# Patient Record
Sex: Female | Born: 1960 | Race: White | Hispanic: No | Smoking: Never smoker
Health system: Southern US, Community
[De-identification: ages and names within clinical notes are randomized; demographics above are authoritative.]

## PROBLEM LIST (undated history)

## (undated) DIAGNOSIS — R519 Headache, unspecified: Secondary | ICD-10-CM

## (undated) DIAGNOSIS — Z789 Other specified health status: Secondary | ICD-10-CM

## (undated) HISTORY — PX: DILATION AND CURETTAGE OF UTERUS: SHX78

---

## 2002-05-28 ENCOUNTER — Other Ambulatory Visit: Admission: RE | Admit: 2002-05-28 | Discharge: 2002-05-28 | Payer: Self-pay | Admitting: Obstetrics and Gynecology

## 2003-06-05 ENCOUNTER — Other Ambulatory Visit: Admission: RE | Admit: 2003-06-05 | Discharge: 2003-06-05 | Payer: Self-pay | Admitting: Obstetrics and Gynecology

## 2004-09-18 ENCOUNTER — Other Ambulatory Visit: Admission: RE | Admit: 2004-09-18 | Discharge: 2004-09-18 | Payer: Self-pay | Admitting: Obstetrics and Gynecology

## 2008-06-20 ENCOUNTER — Encounter: Admission: RE | Admit: 2008-06-20 | Discharge: 2008-06-20 | Payer: Self-pay | Admitting: Internal Medicine

## 2011-07-13 ENCOUNTER — Other Ambulatory Visit (HOSPITAL_COMMUNITY): Payer: Self-pay | Admitting: Obstetrics and Gynecology

## 2011-07-13 DIAGNOSIS — R1031 Right lower quadrant pain: Secondary | ICD-10-CM

## 2011-07-14 ENCOUNTER — Encounter (HOSPITAL_COMMUNITY): Admission: EM | Disposition: A | Discharge status: Home / Self Care | Payer: Self-pay | Source: Home / Self Care

## 2011-07-14 ENCOUNTER — Inpatient Hospital Stay (HOSPITAL_COMMUNITY): Payer: BC Managed Care – PPO | Admitting: Anesthesiology

## 2011-07-14 ENCOUNTER — Encounter (HOSPITAL_COMMUNITY): Payer: Self-pay | Admitting: Anesthesiology

## 2011-07-14 ENCOUNTER — Ambulatory Visit (HOSPITAL_COMMUNITY)
Admission: RE | Admit: 2011-07-14 | Discharge: 2011-07-14 | Disposition: A | Discharge status: Home / Self Care | Payer: BC Managed Care – PPO | Source: Ambulatory Visit | Attending: Obstetrics and Gynecology | Admitting: Obstetrics and Gynecology

## 2011-07-14 ENCOUNTER — Inpatient Hospital Stay (HOSPITAL_COMMUNITY)
Admission: EM | Admit: 2011-07-14 | Discharge: 2011-07-18 | DRG: 883 | Disposition: A | Discharge status: Home / Self Care | Payer: BC Managed Care – PPO | Attending: Surgery | Admitting: Surgery

## 2011-07-14 ENCOUNTER — Encounter (HOSPITAL_COMMUNITY): Payer: Self-pay | Admitting: *Deleted

## 2011-07-14 ENCOUNTER — Other Ambulatory Visit: Payer: Self-pay

## 2011-07-14 DIAGNOSIS — R112 Nausea with vomiting, unspecified: Secondary | ICD-10-CM | POA: Insufficient documentation

## 2011-07-14 DIAGNOSIS — J309 Allergic rhinitis, unspecified: Secondary | ICD-10-CM | POA: Diagnosis present

## 2011-07-14 DIAGNOSIS — R1031 Right lower quadrant pain: Secondary | ICD-10-CM | POA: Insufficient documentation

## 2011-07-14 DIAGNOSIS — K352 Acute appendicitis with generalized peritonitis, without abscess: Secondary | ICD-10-CM

## 2011-07-14 DIAGNOSIS — K3533 Acute appendicitis with perforation and localized peritonitis, with abscess: Principal | ICD-10-CM | POA: Diagnosis present

## 2011-07-14 DIAGNOSIS — Z01812 Encounter for preprocedural laboratory examination: Secondary | ICD-10-CM

## 2011-07-14 DIAGNOSIS — G4700 Insomnia, unspecified: Secondary | ICD-10-CM | POA: Diagnosis present

## 2011-07-14 DIAGNOSIS — R509 Fever, unspecified: Secondary | ICD-10-CM | POA: Insufficient documentation

## 2011-07-14 DIAGNOSIS — K560 Paralytic ileus: Secondary | ICD-10-CM | POA: Diagnosis present

## 2011-07-14 HISTORY — PX: LAPAROSCOPIC APPENDECTOMY: SHX408

## 2011-07-14 HISTORY — DX: Other specified health status: Z78.9

## 2011-07-14 HISTORY — PX: APPENDECTOMY: SHX54

## 2011-07-14 LAB — DIFFERENTIAL
Eosinophils Absolute: 0.1 10*3/uL (ref 0.0–0.7)
Eosinophils Relative: 1 % (ref 0–5)
Lymphs Abs: 2 10*3/uL (ref 0.7–4.0)
Monocytes Relative: 5 % (ref 3–12)

## 2011-07-14 LAB — BASIC METABOLIC PANEL
BUN: 7 mg/dL (ref 6–23)
Calcium: 9.4 mg/dL (ref 8.4–10.5)
GFR calc non Af Amer: >90 mL/min (ref >90)
Glucose, Bld: 73 mg/dL (ref 70–99)

## 2011-07-14 LAB — CBC
MCH: 31.6 pg (ref 26.0–34.0)
MCV: 92.8 fL (ref 78.0–100.0)
Platelets: 262 10*3/uL (ref 150–400)
RBC: 3.89 MIL/uL (ref 3.87–5.11)

## 2011-07-14 SURGERY — APPENDECTOMY, LAPAROSCOPIC
Anesthesia: General | Site: Abdomen | Wound class: Dirty or Infected

## 2011-07-14 MED ORDER — MORPHINE SULFATE 4 MG/ML IJ SOLN
INTRAMUSCULAR | Status: AC
  Filled 2011-07-14: qty 1

## 2011-07-14 MED ORDER — SUFENTANIL CITRATE 50 MCG/ML IV SOLN
INTRAVENOUS | Status: DC | PRN
  Administered 2011-07-14: 5 ug via INTRAVENOUS
  Administered 2011-07-14: 10 ug via INTRAVENOUS
  Administered 2011-07-14: 5 ug via INTRAVENOUS
  Administered 2011-07-14: 20 ug via INTRAVENOUS
  Administered 2011-07-14: 10 ug via INTRAVENOUS

## 2011-07-14 MED ORDER — ONDANSETRON HCL 4 MG/2ML IJ SOLN
4.0000 mg | Freq: Four times a day (QID) | INTRAMUSCULAR | Status: DC | PRN
  Administered 2011-07-15 – 2011-07-16 (×2): 4 mg via INTRAVENOUS
  Filled 2011-07-14 (×3): qty 2

## 2011-07-14 MED ORDER — KCL IN DEXTROSE-NACL 20-5-0.9 MEQ/L-%-% IV SOLN
INTRAVENOUS | Status: AC
  Filled 2011-07-14: qty 1000

## 2011-07-14 MED ORDER — HEPARIN SODIUM (PORCINE) 5000 UNIT/ML IJ SOLN
5000.0000 [IU] | Freq: Three times a day (TID) | INTRAMUSCULAR | Status: DC
  Administered 2011-07-15 – 2011-07-18 (×10): 5000 [IU] via SUBCUTANEOUS
  Filled 2011-07-14 (×13): qty 1

## 2011-07-14 MED ORDER — PROPOFOL 10 MG/ML IV BOLUS
INTRAVENOUS | Status: DC | PRN
  Administered 2011-07-14: 160 mg via INTRAVENOUS

## 2011-07-14 MED ORDER — IOHEXOL 300 MG/ML  SOLN
100.0000 mL | Freq: Once | INTRAMUSCULAR | Status: AC | PRN
  Administered 2011-07-14: 100 mL via INTRAVENOUS

## 2011-07-14 MED ORDER — PROMETHAZINE HCL 25 MG/ML IJ SOLN: 6.2500 mg | INTRAMUSCULAR | Status: DC | PRN

## 2011-07-14 MED ORDER — BUPIVACAINE-EPINEPHRINE (PF) 0.5% -1:200000 IJ SOLN
INTRAMUSCULAR | Status: AC
  Filled 2011-07-14: qty 10

## 2011-07-14 MED ORDER — CISATRACURIUM BESYLATE 2 MG/ML IV SOLN
INTRAVENOUS | Status: DC | PRN
  Administered 2011-07-14: 2 mg via INTRAVENOUS
  Administered 2011-07-14: 5 mg via INTRAVENOUS

## 2011-07-14 MED ORDER — PANTOPRAZOLE SODIUM 40 MG IV SOLR
40.0000 mg | Freq: Every day | INTRAVENOUS | Status: DC
  Administered 2011-07-14 – 2011-07-15 (×2): 40 mg via INTRAVENOUS
  Filled 2011-07-14 (×3): qty 40

## 2011-07-14 MED ORDER — ACETAMINOPHEN 10 MG/ML IV SOLN
INTRAVENOUS | Status: AC
  Filled 2011-07-14: qty 100

## 2011-07-14 MED ORDER — LIDOCAINE HCL (CARDIAC) 20 MG/ML IV SOLN
INTRAVENOUS | Status: DC | PRN
  Administered 2011-07-14: 100 mg via INTRAVENOUS

## 2011-07-14 MED ORDER — SODIUM CHLORIDE 0.9 % IV SOLN
INTRAVENOUS | Status: AC
  Filled 2011-07-14: qty 1

## 2011-07-14 MED ORDER — ONDANSETRON HCL 4 MG PO TABS
4.0000 mg | ORAL_TABLET | Freq: Four times a day (QID) | ORAL | Status: DC | PRN
  Administered 2011-07-14: 4 mg via ORAL
  Filled 2011-07-14: qty 1

## 2011-07-14 MED ORDER — MORPHINE SULFATE 2 MG/ML IJ SOLN
2.0000 mg | INTRAMUSCULAR | Status: DC | PRN
  Administered 2011-07-14 – 2011-07-15 (×4): 2 mg via INTRAVENOUS
  Filled 2011-07-14 (×4): qty 1

## 2011-07-14 MED ORDER — NEOSTIGMINE METHYLSULFATE 1 MG/ML IJ SOLN
INTRAMUSCULAR | Status: DC | PRN
  Administered 2011-07-14: 4 mg via INTRAVENOUS

## 2011-07-14 MED ORDER — ACETAMINOPHEN 10 MG/ML IV SOLN
INTRAVENOUS | Status: DC | PRN
  Administered 2011-07-14: 1000 mg via INTRAVENOUS

## 2011-07-14 MED ORDER — MORPHINE SULFATE 4 MG/ML IJ SOLN
4.0000 mg | Freq: Once | INTRAMUSCULAR | Status: AC
  Administered 2011-07-14: 4 mg via INTRAVENOUS

## 2011-07-14 MED ORDER — DEXAMETHASONE SODIUM PHOSPHATE 10 MG/ML IJ SOLN
INTRAMUSCULAR | Status: DC | PRN
  Administered 2011-07-14: 6 mg via INTRAVENOUS

## 2011-07-14 MED ORDER — SODIUM CHLORIDE 0.9 % IV SOLN
1.0000 g | INTRAVENOUS | Status: DC
  Administered 2011-07-15 – 2011-07-17 (×3): 1 g via INTRAVENOUS
  Filled 2011-07-14 (×5): qty 1

## 2011-07-14 MED ORDER — HYDROMORPHONE HCL PF 1 MG/ML IJ SOLN
0.2500 mg | INTRAMUSCULAR | Status: DC | PRN
  Administered 2011-07-14 (×2): 0.5 mg via INTRAVENOUS

## 2011-07-14 MED ORDER — 0.9 % SODIUM CHLORIDE (POUR BTL) OPTIME
TOPICAL | Status: DC | PRN
  Administered 2011-07-14: 1000 mL

## 2011-07-14 MED ORDER — ONDANSETRON HCL 4 MG/2ML IJ SOLN
INTRAMUSCULAR | Status: DC | PRN
  Administered 2011-07-14: 4 mg via INTRAVENOUS

## 2011-07-14 MED ORDER — GLYCOPYRROLATE 0.2 MG/ML IJ SOLN
INTRAMUSCULAR | Status: DC | PRN
  Administered 2011-07-14: .6 mg via INTRAVENOUS

## 2011-07-14 MED ORDER — ONDANSETRON HCL 4 MG/2ML IJ SOLN
4.0000 mg | Freq: Once | INTRAMUSCULAR | Status: AC
  Administered 2011-07-14: 4 mg via INTRAVENOUS
  Filled 2011-07-14: qty 2

## 2011-07-14 MED ORDER — MORPHINE SULFATE 4 MG/ML IJ SOLN
8.0000 mg | Freq: Once | INTRAMUSCULAR | Status: AC
  Administered 2011-07-14: 8 mg via INTRAVENOUS
  Filled 2011-07-14: qty 2

## 2011-07-14 MED ORDER — TRAZODONE HCL 50 MG PO TABS
50.0000 mg | ORAL_TABLET | Freq: Every day | ORAL | Status: DC
  Administered 2011-07-15 – 2011-07-17 (×3): 50 mg via ORAL
  Filled 2011-07-14 (×5): qty 1

## 2011-07-14 MED ORDER — BUPIVACAINE-EPINEPHRINE 0.5% -1:200000 IJ SOLN
INTRAMUSCULAR | Status: DC | PRN
  Administered 2011-07-14: 16 mL

## 2011-07-14 MED ORDER — HYDROMORPHONE HCL PF 1 MG/ML IJ SOLN
INTRAMUSCULAR | Status: AC
  Filled 2011-07-14: qty 1

## 2011-07-14 MED ORDER — SODIUM CHLORIDE 0.9 % IV SOLN: Freq: Once | INTRAVENOUS | Status: DC

## 2011-07-14 MED ORDER — SODIUM CHLORIDE 0.9 % IV BOLUS (SEPSIS)
1000.0000 mL | Freq: Once | INTRAVENOUS | Status: AC
  Administered 2011-07-14: 1000 mL via INTRAVENOUS
  Administered 2011-07-14: 125 mL via INTRAVENOUS

## 2011-07-14 MED ORDER — OXYCODONE-ACETAMINOPHEN 5-325 MG PO TABS
1.0000 | ORAL_TABLET | ORAL | Status: DC | PRN
  Administered 2011-07-15 – 2011-07-16 (×4): 2 via ORAL
  Administered 2011-07-17: 1 via ORAL
  Administered 2011-07-17 (×3): 2 via ORAL
  Administered 2011-07-17: 1 via ORAL
  Administered 2011-07-18: 2 via ORAL
  Filled 2011-07-14 (×9): qty 2

## 2011-07-14 MED ORDER — KCL IN DEXTROSE-NACL 20-5-0.9 MEQ/L-%-% IV SOLN
INTRAVENOUS | Status: DC
  Administered 2011-07-14 – 2011-07-15 (×3): via INTRAVENOUS
  Administered 2011-07-16: 20 mL/h via INTRAVENOUS
  Filled 2011-07-14 (×6): qty 1000

## 2011-07-14 MED ORDER — LACTATED RINGERS IR SOLN
Status: DC | PRN
  Administered 2011-07-14: 2000 mL

## 2011-07-14 MED ORDER — ONDANSETRON HCL 4 MG/2ML IJ SOLN
4.0000 mg | Freq: Four times a day (QID) | INTRAMUSCULAR | Status: DC | PRN
  Administered 2011-07-18: 4 mg via INTRAVENOUS

## 2011-07-14 MED ORDER — LACTATED RINGERS IV SOLN
INTRAVENOUS | Status: DC | PRN
  Administered 2011-07-14 (×2): via INTRAVENOUS

## 2011-07-14 MED ORDER — SODIUM CHLORIDE 0.9 % IV SOLN
1.0000 g | INTRAVENOUS | Status: DC
  Administered 2011-07-14: 1 g via INTRAVENOUS
  Filled 2011-07-14 (×2): qty 1

## 2011-07-14 MED ORDER — KCL IN DEXTROSE-NACL 20-5-0.9 MEQ/L-%-% IV SOLN
INTRAVENOUS | Status: DC
  Filled 2011-07-14 (×4): qty 1000

## 2011-07-14 MED ORDER — SUCCINYLCHOLINE CHLORIDE 20 MG/ML IJ SOLN
INTRAMUSCULAR | Status: DC | PRN
  Administered 2011-07-14: 80 mg via INTRAVENOUS

## 2011-07-14 SURGICAL SUPPLY — 40 items
APPLIER CLIP 5 13 M/L LIGAMAX5 (MISCELLANEOUS)
APPLIER CLIP ROT 10 11.4 M/L (STAPLE) ×2
CANISTER SUCTION 2500CC (MISCELLANEOUS) ×2 IMPLANT
CHLORAPREP W/TINT 26ML (MISCELLANEOUS) ×2 IMPLANT
CLIP APPLIE 5 13 M/L LIGAMAX5 (MISCELLANEOUS) IMPLANT
CLIP APPLIE ROT 10 11.4 M/L (STAPLE) ×1 IMPLANT
CLOTH BEACON ORANGE TIMEOUT ST (SAFETY) ×2 IMPLANT
CUTTER FLEX LINEAR 45M (STAPLE) ×2 IMPLANT
DECANTER SPIKE VIAL GLASS SM (MISCELLANEOUS) ×2 IMPLANT
DERMABOND ADVANCED (GAUZE/BANDAGES/DRESSINGS) ×1
DERMABOND ADVANCED .7 DNX12 (GAUZE/BANDAGES/DRESSINGS) ×1 IMPLANT
DRAPE LAPAROSCOPIC ABDOMINAL (DRAPES) ×2 IMPLANT
ELECT REM PT RETURN 9FT ADLT (ELECTROSURGICAL) ×2
ELECTRODE REM PT RTRN 9FT ADLT (ELECTROSURGICAL) ×1 IMPLANT
GLOVE BIOGEL PI IND STRL 7.0 (GLOVE) ×1 IMPLANT
GLOVE BIOGEL PI INDICATOR 7.0 (GLOVE) ×1
GLOVE SS BIOGEL STRL SZ 7.5 (GLOVE) ×1 IMPLANT
GLOVE SUPERSENSE BIOGEL SZ 7.5 (GLOVE) ×1
GOWN STRL NON-REIN LRG LVL3 (GOWN DISPOSABLE) ×2 IMPLANT
GOWN STRL REIN XL XLG (GOWN DISPOSABLE) ×4 IMPLANT
IV LACTATED RINGERS 1000ML (IV SOLUTION) ×2 IMPLANT
KIT BASIN OR (CUSTOM PROCEDURE TRAY) ×2 IMPLANT
NS IRRIG 1000ML POUR BTL (IV SOLUTION) ×4 IMPLANT
PENCIL BUTTON HOLSTER BLD 10FT (ELECTRODE) ×2 IMPLANT
POUCH SPECIMEN RETRIEVAL 10MM (ENDOMECHANICALS) ×2 IMPLANT
RELOAD 45 VASCULAR/THIN (ENDOMECHANICALS) IMPLANT
RELOAD STAPLE TA45 3.5 REG BLU (ENDOMECHANICALS) ×2 IMPLANT
SCALPEL HARMONIC ACE (MISCELLANEOUS) ×2 IMPLANT
SET IRRIG TUBING LAPAROSCOPIC (IRRIGATION / IRRIGATOR) ×2 IMPLANT
SLEEVE Z-THREAD 5X100MM (TROCAR) IMPLANT
SOLUTION ANTI FOG 6CC (MISCELLANEOUS) ×2 IMPLANT
STRIP CLOSURE SKIN 1/2X4 (GAUZE/BANDAGES/DRESSINGS) ×2 IMPLANT
SUT MNCRL AB 4-0 PS2 18 (SUTURE) ×2 IMPLANT
TOWEL OR 17X26 10 PK STRL BLUE (TOWEL DISPOSABLE) ×2 IMPLANT
TRAY FOLEY CATH 14FRSI W/METER (CATHETERS) ×2 IMPLANT
TRAY LAP CHOLE (CUSTOM PROCEDURE TRAY) ×2 IMPLANT
TROCAR XCEL BLUNT TIP 100MML (ENDOMECHANICALS) ×2 IMPLANT
TROCAR Z-THREAD FIOS 12X100MM (TROCAR) ×2 IMPLANT
TROCAR Z-THREAD FIOS 5X100MM (TROCAR) ×2 IMPLANT
TUBING INSUFFLATION 10FT LAP (TUBING) ×2 IMPLANT

## 2011-07-14 NOTE — ED Notes (Signed)
Pt had CT done at [facility] today secondary to abdominal pain, pt was called after sent home and told she needed to come to the ER because of appendicitis. Pt co abdominal pain in RLQ since Sunday morning, pain described as sharp, and stabbing

## 2011-07-14 NOTE — H&P (Signed)
Tina Cannon is an 51 y.o. female.   Referring:  Dr. Marcelle Overlie Chief Complaint: Appendicitis  HPI: Patient's a 51 year old female who started having abdominal pain Sunday when she got up. She said her abdomen got tightened bloated she started having pain going to her right lower quadrant. She developed nausea and vomiting. She had similar symptoms in the past which was thought to be secondary to an ovarian cyst. She essentially just tolerated the symptoms. Monday she attempts to get an appointment with Dr. Marcelle Overlie. But was unable to see him till Tuesday. She continued to have symptoms with pain, nausea, vomiting, and fever. She did see Dr. Marcelle Overlie on Tuesday, his evaluation showed no OB/GYN related problems. A CT scan was ordered but she was unable to have study until this a.m. Today she had a CT scan which showed a large appendix with a tiny appendicolith moderate periappendiceal inflammatory changes consistent with acute appendicitis. There was no evidence of abscess or free fluid. After the study was related to Dr. Marcelle Overlie she was directed to her emergency room here at [facility]. Dr. Marcelle Overlie office contacted [place] and we subsequently contacted the ER. Labs show a minimally elevated white count with her BMP being essentially normal. We will plan to admit the patient for a laparoscopic appendectomy.  PMH:  1. Insomnia             2. Seasonal allergies  Past Surgical History  Procedure Date  . D & C with cone bx.     History reviewed. No pertinent family history. Social History:  does not have a smoking history on file. She does not have any smokeless tobacco history on file. She reports that she drinks alcohol. She reports that she does not use illicit drugs. 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  Allergies: No Known Allergies   (Not in a hospital admission)  Results for orders placed during the hospital encounter of 07/14/11 (from the past 48 hour(s))  CBC     Status:  Abnormal   Collection Time   07/14/11  1:58 PM      Component Value Range Comment   WBC 11.9 (*) 4.0 - 10.5 (K/uL)    RBC 3.89  3.87 - 5.11 (MIL/uL)    Hemoglobin 12.3  12.0 - 15.0 (g/dL)    HCT 16.1  09.6 - 04.5 (%)    MCV 92.8  78.0 - 100.0 (fL)    MCH 31.6  26.0 - 34.0 (pg)    MCHC 34.1  30.0 - 36.0 (g/dL)    RDW 40.9  81.1 - 91.4 (%)    Platelets 262  150 - 400 (K/uL)   DIFFERENTIAL     Status: Abnormal   Collection Time   07/14/11  1:58 PM      Component Value Range Comment   Neutrophils Relative 78 (*) 43 - 77 (%)    Neutro Abs 9.2 (*) 1.7 - 7.7 (K/uL)    Lymphocytes Relative 17  12 - 46 (%)    Lymphs Abs 2.0  0.7 - 4.0 (K/uL)    Monocytes Relative 5  3 - 12 (%)    Monocytes Absolute 0.5  0.1 - 1.0 (K/uL)    Eosinophils Relative 1  0 - 5 (%)    Eosinophils Absolute 0.1  0.0 - 0.7 (K/uL)    Basophils Relative 0  0 - 1 (%)    Basophils Absolute 0.0  0.0 - 0.1 (K/uL)   BASIC METABOLIC PANEL  Status: Normal   Collection Time   07/14/11  1:58 PM      Component Value Range Comment   Sodium 138  135 - 145 (mEq/L)    Potassium 3.6  3.5 - 5.1 (mEq/L)    Chloride 102  96 - 112 (mEq/L)    CO2 26  19 - 32 (mEq/L)    Glucose, Bld 73  70 - 99 (mg/dL)    BUN 7  6 - 23 (mg/dL)    Creatinine, Ser 9.60  0.50 - 1.10 (mg/dL)    Calcium 9.4  8.4 - 10.5 (mg/dL)    GFR calc non Af Amer >90  >90 (mL/min)    GFR calc Af Amer >90  >90 (mL/min)   PROTIME-INR     Status: Normal   Collection Time   07/14/11  1:58 PM      Component Value Range Comment   Prothrombin Time 13.7  11.6 - 15.2 (seconds)    INR 1.03  0.00 - 1.49     Ct Abdomen Pelvis W Contrast  07/14/2011  *RADIOLOGY REPORT*  Clinical Data: Right lower quadrant pain.  Nausea and vomiting. Fever.  CT ABDOMEN AND PELVIS WITH CONTRAST  Technique:  Multidetector CT imaging of the abdomen and pelvis was performed following the standard protocol during bolus administration of intravenous contrast.  Contrast: OMNIPAQUE IOHEXOL 300  MG/ML  SOLN  Comparison: None.  Findings: The abdominal parenchymal organs are normal in appearance.  Gallbladder is unremarkable.  No evidence of hydronephrosis.  No soft tissue masses or lymphadenopathy identified within the abdomen or pelvis.  Uterus and ovaries are unremarkable in appearance.  An enlarged appendix is seen with a tiny appendicolith and moderate periappendiceal inflammatory changes.  This is consistent with acute appendicitis.  There is no evidence of abscess or free fluid. No evidence of bowel obstruction.  IMPRESSION:  1.  Positive for acute appendicitis. 2.  No evidence of abscess or other complication.  These results were called by telephone on 07/14/2011  at  0940 hours to  Dr. Dennie Bible office nurse Kennyth Arnold, who verbally acknowledged these results.  Original Report Authenticated By: Danae Orleans, M.D.    Review of Systems  Constitutional: Positive for fever (up to 102 since Sunday) and chills. Negative for weight loss, malaise/fatigue and diaphoresis.  Eyes: Negative.   Respiratory: Negative.   Cardiovascular: Negative.   Gastrointestinal: Positive for nausea, vomiting, abdominal pain, diarrhea and constipation. Negative for heartburn and blood in stool.  Genitourinary: Negative.   Musculoskeletal: Negative.   Skin: Negative.   Neurological: Negative.  Negative for weakness.  Endo/Heme/Allergies: Negative.   Psychiatric/Behavioral: Negative.     Blood pressure 112/65, pulse 106, temperature 99.7 F (37.6 C), temperature source Oral, resp. rate 18, height 5\' 7"  (1.702 m), weight 77.111 kg (170 lb), last menstrual period 06/25/2011, SpO2 100.00%. Physical Exam  Constitutional: She is oriented to person, place, and time. She appears well-developed and well-nourished. No distress.       Feels warm on exam.  HENT:  Head: Normocephalic and atraumatic.  Nose: Nose normal.  Eyes: Conjunctivae and EOM are normal. Pupils are equal, round, and reactive to light. Right eye  exhibits no discharge. Left eye exhibits discharge. No scleral icterus.  Neck: Normal range of motion. Neck supple. No JVD present. No tracheal deviation present. No thyromegaly present.  Cardiovascular: Normal rate, regular rhythm, normal heart sounds and intact distal pulses.  Exam reveals no gallop.   No murmur heard. Respiratory: Effort normal  and breath sounds normal. No respiratory distress. She has no wheezes. She has no rales. She exhibits no tenderness.  GI: Soft. Bowel sounds are normal. She exhibits no mass. There is tenderness. There is no rebound and no guarding.       Pain generalized mid abdomen Sunday, now mostly RLQ And it hurts there when she tries to walk.  Musculoskeletal: Normal range of motion. She exhibits no edema and no tenderness.  Lymphadenopathy:    She has no cervical adenopathy.  Neurological: She is alert and oriented to person, place, and time. She has normal reflexes. No cranial nerve deficit.  Skin: Skin is warm and dry. No rash noted. No erythema. No pallor.  Psychiatric: She has a normal mood and affect. Her behavior is normal. Judgment and thought content normal.     Assessment/Plan: 1. acute appendicitis with symptoms for the last 3 days. 2. Insomnia 3. Seasonal allergies  Plan: I will start antibiotics rehydrate and  scheduled for surgery later this evening.      Hazen Brumett 07/14/2011, 3:41 PM

## 2011-07-14 NOTE — Anesthesia Preprocedure Evaluation (Addendum)
Anesthesia Evaluation  Patient identified by MRN, date of birth, ID band Patient awake    Reviewed: Allergy & Precautions, H&P , NPO status , Patient's Chart, lab work & pertinent test results  Airway Mallampati: II TM Distance: >3 FB Neck ROM: Full    Dental No notable dental hx.    Pulmonary neg pulmonary ROS,  breath sounds clear to auscultation  Pulmonary exam normal       Cardiovascular Rhythm:Regular Rate:Normal  ECG: NSR.  States HR occasionally races.   Neuro/Psych negative neurological ROS  negative psych ROS   GI/Hepatic negative GI ROS, Neg liver ROS,   Endo/Other  negative endocrine ROS  Renal/GU negative Renal ROS  negative genitourinary   Musculoskeletal negative musculoskeletal ROS (+)   Abdominal   Peds negative pediatric ROS (+)  Hematology negative hematology ROS (+)   Anesthesia Other Findings   Reproductive/Obstetrics negative OB ROS                          Anesthesia Physical Anesthesia Plan  ASA: I  Anesthesia Plan: General   Post-op Pain Management:    Induction: Intravenous  Airway Management Planned:   Additional Equipment:   Intra-op Plan:   Post-operative Plan: Extubation in OR  Informed Consent: I have reviewed the patients History and Physical, chart, labs and discussed the procedure including the risks, benefits and alternatives for the proposed anesthesia with the patient or authorized representative who has indicated his/her understanding and acceptance.   Dental advisory given  Plan Discussed with: CRNA  Anesthesia Plan Comments:         Anesthesia Quick Evaluation

## 2011-07-14 NOTE — Transfer of Care (Signed)
Immediate Anesthesia Transfer of Care Note  Patient: Tina Cannon  Procedure(s) Performed: Procedure(s) (LRB): APPENDECTOMY LAPAROSCOPIC (N/A)  Patient Location: PACU  Anesthesia Type: General  Level of Consciousness: awake, alert  and oriented  Airway & Oxygen Therapy: Patient Spontanous Breathing and Patient connected to face mask oxygen  Post-op Assessment: Report given to PACU RN and Post -op Vital signs reviewed and stable  Post vital signs: Reviewed and stable  Complications: No apparent anesthesia complications

## 2011-07-14 NOTE — Anesthesia Postprocedure Evaluation (Signed)
  Anesthesia Post-op Note  Patient: Tina Cannon  Procedure(s) Performed: Procedure(s) (LRB): APPENDECTOMY LAPAROSCOPIC (N/A)  Patient Location: PACU  Anesthesia Type: General  Level of Consciousness: awake and alert   Airway and Oxygen Therapy: Patient Spontanous Breathing  Post-op Pain: mild  Post-op Assessment: Post-op Vital signs reviewed, Patient's Cardiovascular Status Stable, Respiratory Function Stable, Patent Airway and No signs of Nausea or vomiting  Post-op Vital Signs: stable  Complications: No apparent anesthesia complications

## 2011-07-14 NOTE — ED Provider Notes (Signed)
History     CSN: [id number]  Arrival date & time 07/14/11  1135   First MD Initiated Contact with Patient 07/14/11 1324      Chief Complaint  Patient presents with  . Abdominal Pain    pt reports sudden onset of abd pain on sunday, had outpatient CT scan today and was told to come to ER for appendicitis.     (Consider location/radiation/quality/duration/timing/severity/associated sxs/prior treatment) HPI  History reviewed. No pertinent past medical history.  Past Surgical History  Procedure Date  . Cesarean section     History reviewed. No pertinent family history.  History  Substance Use Topics  . Smoking status: Not on file  . Smokeless tobacco: Not on file  . Alcohol Use: Yes    OB History    Grav Para Term Preterm Abortions TAB SAB Ect Mult Living                  Review of Systems  Allergies  Review of patient's allergies indicates no known allergies.  Home Medications   Current Outpatient Rx  Name Route Sig Dispense Refill  . ACETAMINOPHEN 500 MG PO TABS Oral Take 500 mg by mouth every 6 (six) hours as needed. pain    . B COMPLEX-C PO TABS Oral Take 1 tablet by mouth daily.    Marland Kitchen HYDROCODONE-ACETAMINOPHEN 5-500 MG PO TABS Oral Take 1 tablet by mouth every 6 (six) hours as needed.    . IBUPROFEN 200 MG PO TABS Oral Take 200 mg by mouth every 6 (six) hours as needed. Pain fever    . MULTI-VITAMIN/MINERALS PO TABS Oral Take 1 tablet by mouth daily.    Marland Kitchen FISH OIL 1000 MG PO CAPS Oral Take 1,000 mg by mouth daily.    . TRAZODONE HCL 50 MG PO TABS Oral Take 50 mg by mouth at bedtime.      BP 112/65  Pulse 106  Temp(Src) 99.7 F (37.6 C) (Oral)  Resp 18  Ht 5\' 7"  (1.702 m)  Wt 170 lb (77.111 kg)  BMI 26.63 kg/m2  SpO2 100%  LMP 06/25/2011  Physical Exam  ED Course  Procedures (including critical care time)  Labs Reviewed  CBC - Abnormal; Notable for the following:    WBC 11.9 (*)    All other components within normal limits  DIFFERENTIAL -  Abnormal; Notable for the following:    Neutrophils Relative 78 (*)    Neutro Abs 9.2 (*)    All other components within normal limits  BASIC METABOLIC PANEL  PROTIME-INR   Ct Abdomen Pelvis W Contrast  07/14/2011  *RADIOLOGY REPORT*  Clinical Data: Right lower quadrant pain.  Nausea and vomiting. Fever.  CT ABDOMEN AND PELVIS WITH CONTRAST  Technique:  Multidetector CT imaging of the abdomen and pelvis was performed following the standard protocol during bolus administration of intravenous contrast.  Contrast: OMNIPAQUE IOHEXOL 300 MG/ML  SOLN  Comparison: None.  Findings: The abdominal parenchymal organs are normal in appearance.  Gallbladder is unremarkable.  No evidence of hydronephrosis.  No soft tissue masses or lymphadenopathy identified within the abdomen or pelvis.  Uterus and ovaries are unremarkable in appearance.  An enlarged appendix is seen with a tiny appendicolith and moderate periappendiceal inflammatory changes.  This is consistent with acute appendicitis.  There is no evidence of abscess or free fluid. No evidence of bowel obstruction.  IMPRESSION:  1.  Positive for acute appendicitis. 2.  No evidence of abscess or other complication.  These results were called by telephone on 07/14/2011  at  0940 hours to  Dr. Dennie Bible office nurse Kennyth Arnold, who verbally acknowledged these results.  Original Report Authenticated By: Danae Orleans, M.D.     No diagnosis found.  Date: 07/14/2011  Rate: 87 Rhythm: normal sinus rhythm  QRS Axis: normal  Intervals: normal  ST/T Wave abnormalities: normal  Conduction Disutrbances: none  Narrative Interpretation: unremarkable      MDM  Medical screening examination/treatment/procedure(s) were conducted as a shared visit with non-physician practitioner(s) and myself.  I personally evaluated the patient during the encounter.  History and physical and CAT scan are consistent with acute appendicitis. Discuss with general surgery.  Admit        Donnetta Hutching, MD 07/14/11 1525

## 2011-07-14 NOTE — ED Provider Notes (Signed)
History     CSN: [id number]  Arrival date & time 07/14/11  1135   First MD Initiated Contact with Patient 07/14/11 1324      Chief Complaint  Patient presents with  . Abdominal Pain    pt reports sudden onset of abd pain on sunday, had outpatient CT scan today and was told to come to ER for appendicitis.     (Consider location/radiation/quality/duration/timing/severity/associated sxs/prior treatment) HPI   51 year old female presents to the ED for further management of a newly diagnosed appendicitis. Patient states 3 days ago she experienced an acute onset of pain to the periumbilical region and subsequently radiates down to the right lower quadrant. She also experiencing nausea, vomiting, and also the diarrhea. She thought it was the ovary so she followup with her OB/GYN. At that time an ultrasound was performed and shows no acute finding. Due to her pain, her OB/GYN in order a CT scan for today. An abdominal and pelvis CT shows evidence of an acute appendicitis with no evidence of perforations or abscess. Patient currently complaining of fever and chills, and nausea. States abdominal pain is only worsened with movement. She has no appetite and did not eat anything today. Patient denies headache, chest pain, shortness of breath, back pain, or dysuria.  History reviewed. No pertinent past medical history.  Past Surgical History  Procedure Date  . Cesarean section     History reviewed. No pertinent family history.  History  Substance Use Topics  . Smoking status: Not on file  . Smokeless tobacco: Not on file  . Alcohol Use: Yes    OB History    Grav Para Term Preterm Abortions TAB SAB Ect Mult Living                  Review of Systems  All other systems reviewed and are negative.    Allergies  Review of patient's allergies indicates no known allergies.  Home Medications   Current Outpatient Rx  Name Route Sig Dispense Refill  . ACETAMINOPHEN 500 MG PO TABS Oral  Take 500 mg by mouth every 6 (six) hours as needed. pain    . B COMPLEX-C PO TABS Oral Take 1 tablet by mouth daily.    Marland Kitchen HYDROCODONE-ACETAMINOPHEN 5-500 MG PO TABS Oral Take 1 tablet by mouth every 6 (six) hours as needed.    . IBUPROFEN 200 MG PO TABS Oral Take 200 mg by mouth every 6 (six) hours as needed. Pain fever    . MULTI-VITAMIN/MINERALS PO TABS Oral Take 1 tablet by mouth daily.    Marland Kitchen FISH OIL 1000 MG PO CAPS Oral Take 1,000 mg by mouth daily.    . TRAZODONE HCL 50 MG PO TABS Oral Take 50 mg by mouth at bedtime.      BP 112/65  Pulse 106  Temp(Src) 99.7 F (37.6 C) (Oral)  Resp 18  Ht 5\' 7"  (1.702 m)  Wt 170 lb (77.111 kg)  BMI 26.63 kg/m2  SpO2 100%  LMP 06/25/2011  Physical Exam  Nursing note and vitals reviewed. Constitutional: She is oriented to person, place, and time. She appears well-developed and well-nourished. No distress.       Awake, alert, nontoxic appearance  HENT:  Head: Atraumatic.  Eyes: Conjunctivae are normal. Right eye exhibits no discharge. Left eye exhibits no discharge.  Neck: Neck supple.  Cardiovascular: Normal rate and regular rhythm.   Pulmonary/Chest: Effort normal. No respiratory distress. She exhibits no tenderness.  Abdominal: Soft. There is  tenderness. There is rigidity, guarding and tenderness at McBurney's point. There is no rebound and negative Murphy's sign. No hernia. Hernia confirmed negative in the ventral area.  Musculoskeletal: She exhibits no tenderness.       ROM appears intact, no obvious focal weakness  Neurological: She is alert and oriented to person, place, and time.       Mental status and motor strength appears intact  Skin: Skin is warm. No rash noted.  Psychiatric: She has a normal mood and affect.    ED Course  Procedures (including critical care time)  Labs Reviewed - No data to display Ct Abdomen Pelvis W Contrast  07/14/2011  *RADIOLOGY REPORT*  Clinical Data: Right lower quadrant pain.  Nausea and vomiting.  Fever.  CT ABDOMEN AND PELVIS WITH CONTRAST  Technique:  Multidetector CT imaging of the abdomen and pelvis was performed following the standard protocol during bolus administration of intravenous contrast.  Contrast: OMNIPAQUE IOHEXOL 300 MG/ML  SOLN  Comparison: None.  Findings: The abdominal parenchymal organs are normal in appearance.  Gallbladder is unremarkable.  No evidence of hydronephrosis.  No soft tissue masses or lymphadenopathy identified within the abdomen or pelvis.  Uterus and ovaries are unremarkable in appearance.  An enlarged appendix is seen with a tiny appendicolith and moderate periappendiceal inflammatory changes.  This is consistent with acute appendicitis.  There is no evidence of abscess or free fluid. No evidence of bowel obstruction.  IMPRESSION:  1.  Positive for acute appendicitis. 2.  No evidence of abscess or other complication.  These results were called by telephone on 07/14/2011  at  0940 hours to  Dr. Dennie Bible office nurse Kennyth Arnold, who verbally acknowledged these results.  Original Report Authenticated By: Danae Orleans, M.D.     No diagnosis found.   Date: 07/14/2011  Rate: 87  Rhythm: normal sinus rhythm  QRS Axis: normal  Intervals: normal  ST/T Wave abnormalities: normal  Conduction Disutrbances:none  Narrative Interpretation:   Old EKG Reviewed: none available  Results for orders placed during the hospital encounter of 07/14/11  CBC      Component Value Range   WBC 11.9 (*) 4.0 - 10.5 (K/uL)   RBC 3.89  3.87 - 5.11 (MIL/uL)   Hemoglobin 12.3  12.0 - 15.0 (g/dL)   HCT 16.1  09.6 - 04.5 (%)   MCV 92.8  78.0 - 100.0 (fL)   MCH 31.6  26.0 - 34.0 (pg)   MCHC 34.1  30.0 - 36.0 (g/dL)   RDW 40.9  81.1 - 91.4 (%)   Platelets 262  150 - 400 (K/uL)  DIFFERENTIAL      Component Value Range   Neutrophils Relative 78 (*) 43 - 77 (%)   Neutro Abs 9.2 (*) 1.7 - 7.7 (K/uL)   Lymphocytes Relative 17  12 - 46 (%)   Lymphs Abs 2.0  0.7 - 4.0 (K/uL)    Monocytes Relative 5  3 - 12 (%)   Monocytes Absolute 0.5  0.1 - 1.0 (K/uL)   Eosinophils Relative 1  0 - 5 (%)   Eosinophils Absolute 0.1  0.0 - 0.7 (K/uL)   Basophils Relative 0  0 - 1 (%)   Basophils Absolute 0.0  0.0 - 0.1 (K/uL)  BASIC METABOLIC PANEL      Component Value Range   Sodium 138  135 - 145 (mEq/L)   Potassium 3.6  3.5 - 5.1 (mEq/L)   Chloride 102  96 - 112 (mEq/L)  CO2 26  19 - 32 (mEq/L)   Glucose, Bld 73  70 - 99 (mg/dL)   BUN 7  6 - 23 (mg/dL)   Creatinine, Ser 1.61  0.50 - 1.10 (mg/dL)   Calcium 9.4  8.4 - 09.6 (mg/dL)   GFR calc non Af Amer >90  >90 (mL/min)   GFR calc Af Amer >90  >90 (mL/min)  PROTIME-INR      Component Value Range   Prothrombin Time 13.7  11.6 - 15.2 (seconds)   INR 1.03  0.00 - 1.49    Ct Abdomen Pelvis W Contrast  07/14/2011  *RADIOLOGY REPORT*  Clinical Data: Right lower quadrant pain.  Nausea and vomiting. Fever.  CT ABDOMEN AND PELVIS WITH CONTRAST  Technique:  Multidetector CT imaging of the abdomen and pelvis was performed following the standard protocol during bolus administration of intravenous contrast.  Contrast: OMNIPAQUE IOHEXOL 300 MG/ML  SOLN  Comparison: None.  Findings: The abdominal parenchymal organs are normal in appearance.  Gallbladder is unremarkable.  No evidence of hydronephrosis.  No soft tissue masses or lymphadenopathy identified within the abdomen or pelvis.  Uterus and ovaries are unremarkable in appearance.  An enlarged appendix is seen with a tiny appendicolith and moderate periappendiceal inflammatory changes.  This is consistent with acute appendicitis.  There is no evidence of abscess or free fluid. No evidence of bowel obstruction.  IMPRESSION:  1.  Positive for acute appendicitis. 2.  No evidence of abscess or other complication.  These results were called by telephone on 07/14/2011  at  0940 hours to  Dr. Dennie Bible office nurse Kennyth Arnold, who verbally acknowledged these results.  Original Report Authenticated  By: Danae Orleans, M.D.      MDM  Acute appendicitis confirmed on CT.  preop labs ordered.  Pain medication given.  NPO.  I discussed with my attending.  3:08 PM Dr. Adriana Simas has called Dr. Corliss Skains for surgical consultation.  Pt will be admitted.          Fayrene Helper, PA-C 07/14/11 [phone]

## 2011-07-14 NOTE — Op Note (Signed)
Preoperative Diagnosis: Acute appendicitis [540] APPENDICITIS  Postoprative Diagnosis: Acute appendicitis [540] APPENDICITIS with perforation and abscess  Procedure: Procedure(s): APPENDECTOMY LAPAROSCOPIC   Surgeon: Glenna Fellows T   Assistants: None  Anesthesia:  General endotracheal anesthesiaDiagnos  Indications:   Patient is a 51 year old female who presents with 3 days of lower abdominal pain, fever, and nausea and vomiting. CT scan has revealed evidence of acute appendicitis without evidence of perforation or abscess. She has localized right lower quadrant tenderness. We have recommended proceeding with emergency laparoscopic and possible open appendectomy. We discussed the nature of her illness and the proposed surgery. Risks of anesthetic complications, bleeding, intestinal injury, infection and possible need for open procedure were discussed and understood.  Procedure Detail:  Patient was brought to the operating room, placed in the supine position on the operating table, and general endotracheal anesthesia induced. She had received broad-spectrum preoperative IV antibiotics. PAS port in place. Foley catheter was placed. The abdomen was widely sterilely prepped and draped. Patient timeout was performed and correct procedure verified. Access was obtained with an open Hassan 10 mm trocar at the umbilicus and pneumoperitoneum established. There was noted to be purulent exudate and dense adhesions in the right lower quadrant. Under direct vision a 5 mm trocar was placed in the right upper quadrant and a 12 mm trocar in the left lower quadrant. The terminal ileum was densely adherent up over the tip of the cecum in the right lateral pelvic sidewall. With careful blunt dissection I mobilized the ileum away from the pelvic sidewall and appendix and entered a discrete abscess cavity with thick pus. The small bowel is completely mobilized to the ileocecal valve and the appendix exposed. It  was severely acutely inflamed with perforation at the tip and a walled off abscess against the lateral pelvic sidewall near the right ovary. The fold of Aileen Pilot was markedly inflamed and enlarged as it was in the wall of the abscess. It skewered the mesial appendix and appendix and I removed this with the harmonic scalpel staying away from the wall of the ilium. The terminal ileum and cecum and appendix were mobilized somewhat by dividing lateral peritoneal attachments. I was then able to grasp the appendix and elevated. The mesial appendix was very thickened and inflamed but with careful blunt and harmonic dissection I sequentially divided the mesial appendix being careful to stay away from the ileum and cecum and was able to gradually mobilize the appendix completely down to its space. I clearly delineated the tip of the cecum and the base of the appendix. The appendix was still thickened and moderately inflamed here but appeared suitable for division. The appendix was divided at the tip of the cecum with a single firing of the Endo GIA 45 mm blue load stapler. The staple line appeared intact and secure. The appendix was placed in an Endo Catch bag and brought out through the umbilical incision. The right lower quadrant and pelvis was thoroughly irrigated with several liters of saline and hemostasis was assured. The viscera returned to their anatomic position. Trochars were removed and all CO2 evacuated and a mattress suture secured at the umbilicus. Skin incisions were closed with subcuticular Monocryl and Dermabond. Sponge needle and instrument counts were correct.  Findings: Acute appendicitis with perforation and walled off abscess  Estimated Blood Loss:  less than 50 mL         Drains: none  Blood Given: none          Specimens: appendix  Complications:  * No complications entered in OR log *         Disposition: PACU - hemodynamically stable.         Condition: stable  Mariella Saa MD, FACS  07/14/2011, 7:41 PM

## 2011-07-14 NOTE — Preoperative (Signed)
Beta Blockers   Reason not to administer Beta Blockers:Not Applicable 

## 2011-07-14 NOTE — Anesthesia Procedure Notes (Signed)
Procedure Name: Intubation Date/Time: 07/14/2011 5:50 PM Performed by: Leroy Libman L Patient Re-evaluated:Patient Re-evaluated prior to inductionOxygen Delivery Method: Circle system utilized Preoxygenation: Pre-oxygenation with 100% oxygen Intubation Type: IV induction, Rapid sequence and Cricoid Pressure applied Laryngoscope Size: Miller and 3 Grade View: Grade I Tube type: Oral Tube size: 7.5 mm Number of attempts: 1 Airway Equipment and Method: Stylet Placement Confirmation: ETT inserted through vocal cords under direct vision,  breath sounds checked- equal and bilateral and positive ETCO2 Secured at: 22 cm Tube secured with: Tape Dental Injury: Teeth and Oropharynx as per pre-operative assessment

## 2011-07-15 LAB — CBC
HCT: 33.3 % — ABNORMAL LOW (ref 36.0–46.0)
MCV: 94.3 fL (ref 78.0–100.0)
Platelets: 218 10*3/uL (ref 150–400)
RBC: 3.53 MIL/uL — ABNORMAL LOW (ref 3.87–5.11)
WBC: 12.1 10*3/uL — ABNORMAL HIGH (ref 4.0–10.5)

## 2011-07-15 LAB — BASIC METABOLIC PANEL
CO2: 22 mEq/L (ref 19–32)
Chloride: 104 mEq/L (ref 96–112)
Creatinine, Ser: 0.66 mg/dL (ref 0.50–1.10)
Potassium: 4.4 mEq/L (ref 3.5–5.1)

## 2011-07-15 NOTE — Progress Notes (Signed)
Distended, mild nausea Tolerating clears. Mild ileus Ambulate  Wilmon Arms. Corliss Skains, MD, [facility]  07/15/2011 5:37 PM

## 2011-07-15 NOTE — Progress Notes (Signed)
1 Day Post-Op  Subjective: Not passing flatus yet, but tolerating sips of water without nausea. Moderate pain in RUQ and about incisions  Objective: Vital signs in last 24 hours: Temp:  [97.9 F (36.6 C)-99.7 F (37.6 C)] 98.6 F (37 C) (04/25 0550) Pulse Rate:  [75-106] 91  (04/25 0550) Resp:  [14-29] 17  (04/25 0550) BP: (98-124)/(59-81) 116/81 mmHg (04/25 0550) SpO2:  [96 %-100 %] 96 % (04/25 0550) Weight:  [77.111 kg (170 lb)] 77.111 kg (170 lb) (04/24 1217) Last BM Date: 07/14/11  Intake/Output from previous day: 04/24 0701 - 04/25 0700 In: 2500 [P.O.:600; I.V.:1900] Out: 3200 [Urine:3150; Blood:50] Intake/Output this shift:    General appearance: alert, cooperative, appears stated age and mild distress Resp: clear to auscultation bilaterally Cardio: regular rate and rhythm GI: ABD with rare BS, distended but soft, tender RUQ primarily, mildly tender about incisions which are clean, dry and closed  Lab Results:   Basename 07/15/11 0428 07/14/11 1358  WBC 12.1* 11.9*  HGB 11.1* 12.3  HCT 33.3* 36.1  PLT 218 262   BMET  Basename 07/15/11 0428 07/14/11 1358  NA 136 138  K 4.4 3.6  CL 104 102  CO2 22 26  GLUCOSE 208* 73  BUN 4* 7  CREATININE 0.66 0.66  CALCIUM 8.9 9.4   PT/INR  Basename 07/14/11 1358  LABPROT 13.7  INR 1.03   ABG No results found for this basename: PHART:2,PCO2:2,PO2:2,HCO3:2 in the last 72 hours  Studies/Results: Ct Abdomen Pelvis W Contrast  07/14/2011  *RADIOLOGY REPORT*  Clinical Data: Right lower quadrant pain.  Nausea and vomiting. Fever.  CT ABDOMEN AND PELVIS WITH CONTRAST  Technique:  Multidetector CT imaging of the abdomen and pelvis was performed following the standard protocol during bolus administration of intravenous contrast.  Contrast: OMNIPAQUE IOHEXOL 300 MG/ML  SOLN  Comparison: None.  Findings: The abdominal parenchymal organs are normal in appearance.  Gallbladder is unremarkable.  No evidence of hydronephrosis.   No soft tissue masses or lymphadenopathy identified within the abdomen or pelvis.  Uterus and ovaries are unremarkable in appearance.  An enlarged appendix is seen with a tiny appendicolith and moderate periappendiceal inflammatory changes.  This is consistent with acute appendicitis.  There is no evidence of abscess or free fluid. No evidence of bowel obstruction.  IMPRESSION:  1.  Positive for acute appendicitis. 2.  No evidence of abscess or other complication.  These results were called by telephone on 07/14/2011  at  0940 hours to  Dr. Dennie Bible office nurse Kennyth Arnold, who verbally acknowledged these results.  Original Report Authenticated By: Danae Orleans, M.D.    Anti-infectives: Anti-infectives     Start     Dose/Rate Route Frequency Ordered Stop   07/15/11 1800   ertapenem (INVANZ) 1 g in sodium chloride 0.9 % 50 mL IVPB        1 g 100 mL/hr over 30 Minutes Intravenous Every 24 hours 07/14/11 2106     07/14/11 1716   sodium chloride 0.9 % with ertapenem [facility]) ADS Med     Comments: JONES, PATINA: cabinet override         07/14/11 1716 07/15/11 0529   07/14/11 1700   ertapenem (INVANZ) 1 g in sodium chloride 0.9 % 50 mL IVPB        1 g 100 mL/hr over 30 Minutes Intravenous Every 24 hours 07/14/11 1558            Assessment/Plan: s/p Procedure(s) (LRB): APPENDECTOMY LAPAROSCOPIC (N/A)  Lap Appy- POD#1- mild ileus, continue clears ID- Continue INVANZ Day 2 FEN- continue clears VTE- SQ Heparin, ambulate DISPO- Some ileus, continue clears, await resolution of ileus, mobilize OOB,  Continue ABX therapy due to ruptured appy  LOS: 1 day    Marasia Newhall,PA-C Pager [phone] General Trauma Pager [phone]

## 2011-07-16 LAB — CBC
HCT: 28.3 % — ABNORMAL LOW (ref 36.0–46.0)
Hemoglobin: 9.4 g/dL — ABNORMAL LOW (ref 12.0–15.0)
MCV: 96.6 fL (ref 78.0–100.0)
RDW: 13.5 % (ref 11.5–15.5)
WBC: 7.1 10*3/uL (ref 4.0–10.5)

## 2011-07-16 MED ORDER — BISACODYL 10 MG RE SUPP
10.0000 mg | Freq: Once | RECTAL | Status: AC
  Administered 2011-07-16: 10 mg via RECTAL
  Filled 2011-07-16: qty 1

## 2011-07-16 MED ORDER — MORPHINE SULFATE 2 MG/ML IJ SOLN: 2.0000 mg | INTRAMUSCULAR | Status: DC | PRN

## 2011-07-16 NOTE — Progress Notes (Signed)
Patient ID: Tina Cannon, female   DOB: [birth year], 51 y.o.   MRN: [id number] 2 Days Post-Op  Subjective: Started passing some flatus overnight and this am.  Pain in RLQ is better and she did not have any pain medication overnight.   Objective: Vital signs in last 24 hours: Temp:  [98.4 F (36.9 C)-98.7 F (37.1 C)] 98.4 F (36.9 C) (04/26 0605) Pulse Rate:  [67-79] 73  (04/26 0605) Resp:  [16-20] 18  (04/26 0605) BP: (104-115)/(56-68) 115/56 mmHg (04/26 0605) SpO2:  [96 %-99 %] 96 % (04/26 0605) Last BM Date: 07/14/11  Intake/Output from previous day: 04/25 0701 - 04/26 0700 In: 2293 [P.O.:720; I.V.:1573] Out: 3935 [Urine:3935] Intake/Output this shift:    General appearance: alert, cooperative, appears stated age and no distress Resp: clear to auscultation bilaterally Cardio: regular rate and rhythm GI: ABD +BS, distended but soft, mild tenderness RLQ, mildly tender about incisions which are clean, dry and closed  Lab Results:   Basename 07/16/11 0410 07/15/11 0428  WBC 7.1 12.1*  HGB 9.4* 11.1*  HCT 28.3* 33.3*  PLT 205 218   BMET  Basename 07/15/11 0428 07/14/11 1358  NA 136 138  K 4.4 3.6  CL 104 102  CO2 22 26  GLUCOSE 208* 73  BUN 4* 7  CREATININE 0.66 0.66  CALCIUM 8.9 9.4   PT/INR  Basename 07/14/11 1358  LABPROT 13.7  INR 1.03   ABG No results found for this basename: PHART:2,PCO2:2,PO2:2,HCO3:2 in the last 72 hours  Studies/Results: Ct Abdomen Pelvis W Contrast  07/14/2011  *RADIOLOGY REPORT*  Clinical Data: Right lower quadrant pain.  Nausea and vomiting. Fever.  CT ABDOMEN AND PELVIS WITH CONTRAST  Technique:  Multidetector CT imaging of the abdomen and pelvis was performed following the standard protocol during bolus administration of intravenous contrast.  Contrast: OMNIPAQUE IOHEXOL 300 MG/ML  SOLN  Comparison: None.  Findings: The abdominal parenchymal organs are normal in appearance.  Gallbladder is unremarkable.  No evidence  of hydronephrosis.  No soft tissue masses or lymphadenopathy identified within the abdomen or pelvis.  Uterus and ovaries are unremarkable in appearance.  An enlarged appendix is seen with a tiny appendicolith and moderate periappendiceal inflammatory changes.  This is consistent with acute appendicitis.  There is no evidence of abscess or free fluid. No evidence of bowel obstruction.  IMPRESSION:  1.  Positive for acute appendicitis. 2.  No evidence of abscess or other complication.  These results were called by telephone on 07/14/2011  at  0940 hours to  Dr. Dennie Bible office nurse Kennyth Arnold, who verbally acknowledged these results.  Original Report Authenticated By: Danae Orleans, M.D.    Anti-infectives: Anti-infectives     Start     Dose/Rate Route Frequency Ordered Stop   07/15/11 1800   ertapenem (INVANZ) 1 g in sodium chloride 0.9 % 50 mL IVPB        1 g 100 mL/hr over 30 Minutes Intravenous Every 24 hours 07/14/11 2106     07/14/11 1716   sodium chloride 0.9 % with ertapenem [facility]) ADS Med     Comments: JONES, PATINA: cabinet override         07/14/11 1716 07/15/11 0529   07/14/11 1700   ertapenem (INVANZ) 1 g in sodium chloride 0.9 % 50 mL IVPB  Status:  Discontinued        1 g 100 mL/hr over 30 Minutes Intravenous Every 24 hours 07/14/11 1558 07/15/11 1327  Assessment/Plan: s/p Procedure(s) (LRB): APPENDECTOMY LAPAROSCOPIC (N/A) Ruptured Lap Appy- POD#2- ileus resolving, will advance diet and change to po pain medications ID- Continue INVANZ Day 3, T max 99.6, leukocytosis resolved FEN- advancing diet ABL anemia- may be partially dilution, will re check in am, BP and HR normal with no signs of hemodynamic instability.  VTE- SQ Heparin, ambulate DISPO- Ileus resolving, advance po's, Continue ABX therapy due to ruptured appy  Suspect can discharge over the weekend  LOS: 2 days    Christina Waldrop,PA-C Pager [phone] General Trauma Pager [phone]

## 2011-07-16 NOTE — Progress Notes (Signed)
Post-op ileus slowly resolving Dulcolax suppository today Probable discharge tomorrow.  Tina Cannon. Tina Skains, MD, [facility]  07/16/2011 2:38 PM

## 2011-07-16 NOTE — Discharge Instructions (Signed)
Laparoscopic Appendectomy Appendectomy is surgery to remove the appendix. Laparoscopic surgery uses several small cuts (incisions) instead of one large incision. Laparoscopic surgery offers a shorter recovery time and less discomfort. LET YOUR CAREGIVER KNOW ABOUT:  Allergies to food or medicine.   Medicines taken, including vitamins, dietary supplements, herbs, eyedrops, over-the-counter medicines, and creams.   Use of steroids (by mouth or creams).   Previous problems with anesthetics or numbing medicines.   History of bleeding problems or blood clots.   Previous surgery.   Other health problems, including diabetes, heart problems, lung problems, and kidney problems.   Possibility of pregnancy, if this applies.  RISKS AND COMPLICATIONS  Infection. A germ starts growing in the wound. This can usually be treated with antibiotics. In some cases, the wound will need to be opened and cleaned.   Bleeding.   Damage to other organs.   Sores (abscesses).   Chronic pain at the incision sites. This is defined as pain that lasts for more than 3 months.   Blood clots in the legs that may rarely travel to the lungs.   Infection in the lungs (pneumonia).  BEFORE THE PROCEDURE Appendectomy is usually performed immediately after an inflamed appendix (appendicitis) is diagnosed. No preparation is necessary ahead of this procedure. PROCEDURE  You will be given medicine that makes you sleep (general anesthetic). After you are asleep, a flexible tube (catheter) may be inserted into your bladder to drain your urine during surgery. The tube is removed before you wake up after surgery. When you are asleep, carbondioxide gas will be used to inflate your abdomen. This will allow your surgeon to see inside your abdomen and perform your surgery. Three small incisions will be made in your abdomen. Your surgeon will insert a thin, lighted tube (laparoscope) through one of the incisions. Your surgeon will  look through the laparoscope while performing the surgery. Other tools will be inserted through the other incisions. Laparoscopic procedures may not be appropriate when:  There is major scarring from a previous surgery.   The patient has bleeding disorders.   A pregnancy is near term.   There are other conditions which make the laparoscopic procedure impossible, such as an advanced infection or a ruptured appendix.  If your surgeon feels it is not safe to continue with the laparoscopic procedure, he or she will perform an open surgery instead. This gives the surgeon a larger view and more space to work. Open surgery requires a longer recovery time. After your appendix is removed, your incisions will be closed with stitches (sutures) or skin adhesive. AFTER THE PROCEDURE You will be taken to a recovery room. When the anesthesia has worn off, you will be returned to your hospital room. You will be given pain medicines to keep you comfortable. Ask your caregiver how long your hospital stay will be. Document Released: 10/21/2003 Document Revised: 02/25/2011 Document Reviewed: 09/15/2010 Exit[employer] Patient Information 2012 [employer].CCS ______[facility] LAPAROSCOPIC SURGERY: POST OP INSTRUCTIONS Always review your discharge instruction sheet given to you by the facility where your surgery was performed. IF YOU HAVE DISABILITY OR FAMILY LEAVE FORMS, YOU MUST BRING THEM TO THE OFFICE FOR PROCESSING.   DO NOT GIVE THEM TO YOUR DOCTOR.  1. A prescription for pain medication may be given to you upon discharge.  Take your pain medication as prescribed, if needed.  If narcotic pain medicine is not needed, then you may take acetaminophen (Tylenol) or ibuprofen (Advil) as needed. 2. Take your   usually prescribed medications unless otherwise directed. 3. If you need a refill on your pain medication, please contact your pharmacy.  They will contact our office to request authorization.  Prescriptions will not be filled after 5pm or on week-ends. 4. You should follow a light diet the first few days after arrival home, such as soup and crackers, etc.  Be sure to include lots of fluids daily. 5. Most patients will experience some swelling and bruising in the area of the incisions.  Ice packs will help.  Swelling and bruising can take several days to resolve.  6. It is common to experience some constipation if taking pain medication after surgery.  Increasing fluid intake and taking a stool softener (such as Colace) will usually help or prevent this problem from occurring.  A mild laxative (Milk of Magnesia or Miralax) should be taken according to package instructions if there are no bowel movements after 48 hours. 7. Unless discharge instructions indicate otherwise, you may remove your bandages 24-48 hours after surgery, and you may shower at that time.  You may have steri-strips (small skin tapes) in place directly over the incision.  These strips should be left on the skin for 7-10 days.  If your surgeon used skin glue on the incision, you may shower in 24 hours.  The glue will flake off over the next 2-3 weeks.  Any sutures or staples will be removed at the office during your follow-up visit. 8. ACTIVITIES:  You may resume regular (light) daily activities beginning the next day--such as daily self-care, walking, climbing stairs--gradually increasing activities as tolerated.  You may have sexual intercourse when it is comfortable.  Refrain from any heavy lifting or straining until approved by your doctor. a. You may drive when you are no longer taking prescription pain medication, you can comfortably wear a seatbelt, and you can safely maneuver your car and apply brakes. b. RETURN TO WORK:  __________________________________________________________ 9. You should see your doctor in the office for a follow-up appointment approximately 2-3 weeks after your surgery.  Make sure that you call for  this appointment within a day or two after you arrive home to insure a convenient appointment time. 10. OTHER INSTRUCTIONS: __________________________________________________________________________________________________________________________ __________________________________________________________________________________________________________________________ WHEN TO CALL YOUR DOCTOR: 1. Fever over 101.0 2. Inability to urinate 3. Continued bleeding from incision. 4. Increased pain, redness, or drainage from the incision. 5. Increasing abdominal pain  The clinic staff is available to answer your questions during regular business hours.  Please don't hesitate to call and ask to speak to one of the nurses for clinical concerns.  If you have a medical emergency, go to the nearest emergency room or call 911.  A surgeon from [facility] is always on call at the hospital. [address], [address], [place], [place]  [address] ? [address] [address], [place], [place]   [address] [phone] ? [phone] ? FAX [phone] Web site: [email or web address]  

## 2011-07-17 LAB — CBC
MCH: 31.3 pg (ref 26.0–34.0)
MCHC: 32.6 g/dL (ref 30.0–36.0)
Platelets: 238 10*3/uL (ref 150–400)
RDW: 13.3 % (ref 11.5–15.5)

## 2011-07-17 NOTE — Progress Notes (Signed)
Patient ID: Tina Cannon, female   DOB: 1960/07/27, 51 y.o.   MRN: [id number] [facility] Progress Note:   3 Days Post-Op  Subjective: Mental status is clear.  Complaining of bloating and back pain.  Scant bowel activity Objective: Vital signs in last 24 hours: Temp:  [98.5 F (36.9 C)-98.8 F (37.1 C)] 98.8 F (37.1 C) (04/27 0438) Pulse Rate:  [75-82] 75  (04/27 0438) Resp:  [17-18] 17  (04/27 0438) BP: (124-125)/(77-78) 124/77 mmHg (04/27 0438) SpO2:  [93 %-98 %] 93 % (04/27 0438)  Intake/Output from previous day: 04/26 0701 - 04/27 0700 In: 720 [P.O.:720] Out: 2100 [Urine:2100] Intake/Output this shift:    Physical Exam: Work of breathing is  Normal.  Incisions are not red and are covered with glue.   Lab Results:  Results for orders placed during the hospital encounter of 07/14/11 (from the past 48 hour(s))  CBC     Status: Abnormal   Collection Time   07/16/11  4:10 AM      Component Value Range Comment   WBC 7.1  4.0 - 10.5 (K/uL)    RBC 2.93 (*) 3.87 - 5.11 (MIL/uL)    Hemoglobin 9.4 (*) 12.0 - 15.0 (g/dL)    HCT 56.2 (*) 13.0 - 46.0 (%)    MCV 96.6  78.0 - 100.0 (fL)    MCH 32.1  26.0 - 34.0 (pg)    MCHC 33.2  30.0 - 36.0 (g/dL)    RDW 86.5  78.4 - 69.6 (%)    Platelets 205  150 - 400 (K/uL)   CBC     Status: Abnormal   Collection Time   07/17/11  4:16 AM      Component Value Range Comment   WBC 6.9  4.0 - 10.5 (K/uL)    RBC 3.20 (*) 3.87 - 5.11 (MIL/uL)    Hemoglobin 10.0 (*) 12.0 - 15.0 (g/dL)    HCT 29.5 (*) 28.4 - 46.0 (%)    MCV 95.9  78.0 - 100.0 (fL)    MCH 31.3  26.0 - 34.0 (pg)    MCHC 32.6  30.0 - 36.0 (g/dL)    RDW 13.2  44.0 - 10.2 (%)    Platelets 238  150 - 400 (K/uL)     Radiology/Results: No results found.  Anti-infectives: Anti-infectives     Start     Dose/Rate Route Frequency Ordered Stop   07/15/11 1800   ertapenem (INVANZ) 1 g in sodium chloride 0.9 % 50 mL IVPB        1 g 100 mL/hr over 30 Minutes  Intravenous Every 24 hours 07/14/11 2106     07/14/11 1716   sodium chloride 0.9 % with ertapenem [facility]) ADS Med     Comments: JONES, PATINA: cabinet override         07/14/11 1716 07/15/11 0529   07/14/11 1700   ertapenem (INVANZ) 1 g in sodium chloride 0.9 % 50 mL IVPB  Status:  Discontinued        1 g 100 mL/hr over 30 Minutes Intravenous Every 24 hours 07/14/11 1558 07/15/11 1327          Assessment/Plan: Problem List: There is no problem list on file for this patient.   Not ready for discharge.  Encouraged to walk.  Taking po.   3 Days Post-Op    LOS: 3 days   Matt B. Daphine Deutscher, MD, [facility] [phone] beeper [phone]  07/17/2011 8:25 AM

## 2011-07-17 NOTE — ED Provider Notes (Signed)
Medical screening examination/treatment/procedure(s) were conducted as a shared visit with non-physician practitioner(s) and myself.  I personally evaluated the patient during the encounter.  History and physical consistent with appendicitis. CT confirms same. Consult general surgery  Donnetta Hutching, MD 07/17/11 1145

## 2011-07-18 MED ORDER — OXYCODONE-ACETAMINOPHEN 5-325 MG PO TABS: 1.0000 | ORAL_TABLET | ORAL | Status: AC | PRN

## 2011-07-18 NOTE — Progress Notes (Signed)
Patient discharged to home. Left unit in wheelchair pushed by nurse tech. Left in good condition.

## 2011-07-18 NOTE — Discharge Summary (Signed)
Physician Discharge Summary  Patient ID: Tina Cannon MRN: [id number] DOB/AGE: 11-04-1960 50 y.o.  Admit date: 07/14/2011 Discharge date: 07/18/2011  Admission Diagnoses:  appendicitis  Discharge Diagnoses:  Ruptured appendicitis with abscess treated with lap appy  Active Problems:  * No active hospital problems. *    Surgery:  Laparoscopic appendectomy  Discharged Condition: improved  Hospital Course:   Had surgery.  Ileus and antibiotics delayed discharge.  Passed flatus and diet advance.  Incisions OK --discharged  Consults: none  Significant Diagnostic Studies: none    Discharge Exam: Blood pressure 127/76, pulse 80, temperature 98.9 F (37.2 C), temperature source Oral, resp. rate 16, height 5\' 7"  (1.702 m), weight 170 lb (77.111 kg), last menstrual period 06/25/2011, SpO2 97.00%. Incisions healing OK  Disposition: Final discharge disposition not confirmed  Discharge Orders    Future Orders Please Complete By Expires   Diet - low sodium heart healthy      Increase activity slowly      Discharge instructions      Comments:   Shower ad lib Use Miralax if needed to avoid constipation with Percocet   No wound care        Medication List  As of 07/18/2011  7:49 AM   STOP taking these medications         acetaminophen 500 MG tablet      HYDROcodone-acetaminophen 5-500 MG per tablet      ibuprofen 200 MG tablet         TAKE these medications         B-complex with vitamin C tablet   Take 1 tablet by mouth daily.      Fish Oil 1000 MG Caps   Take 1,000 mg by mouth daily.      multivitamin with minerals tablet   Take 1 tablet by mouth daily.      oxyCODONE-acetaminophen 5-325 MG per tablet   Commonly known as: PERCOCET   Take 1-2 tablets by mouth every 4 (four) hours as needed.      traZODone 50 MG tablet   Commonly known as: DESYREL   Take 50 mg by mouth at bedtime.           Follow-up Information    Follow up with HOXWORTH,BENJAMIN T,  MD. Schedule an appointment as soon as possible for a visit in 2 weeks.   Contact information:   [facility] [address] [place] [address] [phone]          Signed: Valarie Merino 07/18/2011, 7:49 AM

## 2011-07-18 NOTE — Progress Notes (Signed)
Patient given discharge instructions.  Prescriptions x 1 also given for pain med. No concerns voiced. PIV removed by chris, NT1 +3 as a skill check off. Cath intact. Pt tolerated well. Awaiting ride to go home. Vwilliams,rn.

## 2011-07-19 ENCOUNTER — Encounter (HOSPITAL_COMMUNITY): Payer: Self-pay | Admitting: General Surgery

## 2011-07-20 ENCOUNTER — Telehealth (INDEPENDENT_AMBULATORY_CARE_PROVIDER_SITE_OTHER): Payer: Self-pay | Admitting: General Surgery

## 2011-07-20 NOTE — Telephone Encounter (Signed)
Patient scheduled for post op appointment 08/12/11 @ 12:15pm w/Dr. Johna Sheriff.  If patient not having any problems appointment date & time will be okay.

## 2011-07-21 ENCOUNTER — Telehealth (INDEPENDENT_AMBULATORY_CARE_PROVIDER_SITE_OTHER): Payer: Self-pay | Admitting: General Surgery

## 2011-07-21 ENCOUNTER — Encounter (INDEPENDENT_AMBULATORY_CARE_PROVIDER_SITE_OTHER): Payer: Self-pay | Admitting: General Surgery

## 2011-07-21 NOTE — Telephone Encounter (Signed)
Pt calling to report she is INTOLERANT of oxycodone:  She cannot sleep and has wild ideations with mild hallucinations.  Calling in hydrocodone 5/325 mg, # 30, 1 po Q 4-6 H prn, NO refill to [facility]:  [phone].  She is doing well at home.  Had first BM today and feeling much more relief from gas after that.

## 2011-07-26 ENCOUNTER — Telehealth (INDEPENDENT_AMBULATORY_CARE_PROVIDER_SITE_OTHER): Payer: Self-pay | Admitting: General Surgery

## 2011-07-26 NOTE — Telephone Encounter (Signed)
Pt calling about serosanguinous fluid seeping from umbilical incision.  Reassured pt and instructed to wash with soap and water, pat dry and cover with dry gauze to prevent soiling her clothing.  Call back if oozing steadily from site, otherwise is benign.  She understands and will comply.

## 2011-07-27 ENCOUNTER — Other Ambulatory Visit (INDEPENDENT_AMBULATORY_CARE_PROVIDER_SITE_OTHER): Payer: Self-pay

## 2011-07-27 ENCOUNTER — Telehealth (INDEPENDENT_AMBULATORY_CARE_PROVIDER_SITE_OTHER): Payer: Self-pay | Admitting: General Surgery

## 2011-07-27 DIAGNOSIS — M79606 Pain in leg, unspecified: Secondary | ICD-10-CM

## 2011-07-27 NOTE — Telephone Encounter (Signed)
Made several attempts to contact Tina Cannon  at [phone], left 3 to 4 messages to call [phone] (Pod 4 direct line # ) Called [phone] which has been changed to [phone] left 2 messages.  Also, called patient emergency contact (sister) did not leave a message.  Patient called while documenting in her chart.  Patient prefers to wait until tomorrow for an appointment with vascular for venous doppler study.  Patient advised to report to the nearest emergency room or call 911 if she has an increase in pain, swelling, numbness, shortness of breath or skin changes/appearance in her lower extremities.  Will schedule U/S Venous Doppler in the morning and call patient with appointment.

## 2011-07-27 NOTE — Telephone Encounter (Signed)
PT CALLED REGARDING PERSISTENT PAIN BEHIND RT KNEE TRAVELING UP TO GROIN AREA AT NIGHT. DURING DAYTIME WHEN SHE IS UP AND ABOUT IT IS NOT NOTICEABLE. SHE HAD EXPERIENCED BILATERAL LEG DISCOMFORT AFTER SURGERY AND PAIN BEHIND LEFT KNEE ALSO, BUT THAT GRADUALLY SUBSIDED. THE DISCOMFORT BEHIND RT KNEE HAS NOT GOTTEN BETTER. NO SWELLING NOTED OF EXTREMITIES OR RED AREAS NOTED. I  REVIEWED THIS WITH DR. HOXWORTH AND HE SAID TO ORDER BILATERAL VENOUS DOPPLER STUDY OF LOWER EXTREMITIES TOMORROW. I ADVISED PATIENT AND TOLD HER CHRISTY WOULD CALL IN AM RE EXAM SCHEDULING/ GY

## 2011-07-28 ENCOUNTER — Ambulatory Visit (HOSPITAL_COMMUNITY)
Admission: RE | Admit: 2011-07-28 | Discharge: 2011-07-28 | Disposition: A | Discharge status: Home / Self Care | Payer: BC Managed Care – PPO | Source: Ambulatory Visit | Attending: General Surgery | Admitting: General Surgery

## 2011-07-28 DIAGNOSIS — M79606 Pain in leg, unspecified: Secondary | ICD-10-CM

## 2011-07-28 DIAGNOSIS — M79609 Pain in unspecified limb: Secondary | ICD-10-CM | POA: Insufficient documentation

## 2011-07-28 NOTE — Progress Notes (Addendum)
VASCULAR LAB PRELIMINARY  PRELIMINARY  PRELIMINARY  PRELIMINARY  Bilateral lower extremity venous duplex completed.    Preliminary report:  Bilateral lower extremity venous duplex  Tina Cannon D, RVS 07/28/2011, 11:41 AM

## 2011-08-12 ENCOUNTER — Encounter (INDEPENDENT_AMBULATORY_CARE_PROVIDER_SITE_OTHER): Payer: Self-pay | Admitting: General Surgery

## 2011-08-12 ENCOUNTER — Ambulatory Visit (INDEPENDENT_AMBULATORY_CARE_PROVIDER_SITE_OTHER): Payer: BC Managed Care – PPO | Admitting: General Surgery

## 2011-08-12 VITALS — BP 130/98 | HR 82 | Temp 97.4°F | Ht 67.5 in | Wt 172.0 lb

## 2011-08-12 DIAGNOSIS — Z09 Encounter for follow-up examination after completed treatment for conditions other than malignant neoplasm: Secondary | ICD-10-CM

## 2011-08-12 LAB — CBC WITH DIFFERENTIAL/PLATELET
Basophils Absolute: 0 10*3/uL (ref 0.0–0.1)
Eosinophils Relative: 3 % (ref 0–5)
Lymphocytes Relative: 40 % (ref 12–46)
Neutro Abs: 3.6 10*3/uL (ref 1.7–7.7)
Neutrophils Relative %: 51 % (ref 43–77)
Platelets: 274 10*3/uL (ref 150–400)
RBC: 4.3 MIL/uL (ref 3.87–5.11)
RDW: 14 % (ref 11.5–15.5)
WBC: 7 10*3/uL (ref 4.0–10.5)

## 2011-08-12 NOTE — Patient Instructions (Signed)
Call her tomorrow to get results of your blood count. If you're not feeling significantly better in 2 weeks or if you're feeling worse at any point call for a recheck.

## 2011-08-12 NOTE — Progress Notes (Signed)
History: The patient returns for routine followup 4 weeks following laparoscopic appendectomy for perforated appendicitis with localized abscess. She has some ongoing concerns about abdominal discomfort. This tends to be mid abdomen around her umbilicus and is definitely related to activity. She had 2 episodes where she had quite a bit of soreness after hardening and throwing a Frisbee. Her appetite is okay. Denies fever. Bowel movements are okay.  Exam: BP 130/98  Pulse 82  Temp(Src) 97.4 F (36.3 C) (Temporal)  Ht 5' 7.5" (1.715 m)  Wt 172 lb (78.019 kg)  BMI 26.54 kg/m2  SpO2 98%  LMP 06/25/2011  Gen.: Does not appear ill Abdomen: Generally soft with very mild midabdominal and periumbilical tenderness. All wounds are well healed I cannot feel any hernias noted no evidence of infection  Assessment and plan: I doubt she has a complication such as abscess. She has a little but of prolonged discomfort compared to what would expect. She did have a perforation. We are going to check a CBC. If her white count is elevated I will get a CT scan. If not I would continue expectant management. I told her if she is not feeling definitely better in 2 weeks to return for recheck. We will call her with the results of her lab work

## 2011-08-13 ENCOUNTER — Telehealth (INDEPENDENT_AMBULATORY_CARE_PROVIDER_SITE_OTHER): Payer: Self-pay

## 2011-08-13 NOTE — Telephone Encounter (Signed)
Patient notified of lab results ( no abnormalities )

## 2012-12-22 ENCOUNTER — Ambulatory Visit (INDEPENDENT_AMBULATORY_CARE_PROVIDER_SITE_OTHER): Payer: BC Managed Care – PPO | Admitting: Neurology

## 2012-12-22 ENCOUNTER — Ambulatory Visit (INDEPENDENT_AMBULATORY_CARE_PROVIDER_SITE_OTHER): Payer: Self-pay

## 2012-12-22 DIAGNOSIS — G5602 Carpal tunnel syndrome, left upper limb: Secondary | ICD-10-CM

## 2012-12-22 DIAGNOSIS — Z0289 Encounter for other administrative examinations: Secondary | ICD-10-CM

## 2012-12-22 DIAGNOSIS — G5600 Carpal tunnel syndrome, unspecified upper limb: Secondary | ICD-10-CM

## 2012-12-22 NOTE — Procedures (Signed)
  HISTORY:  Tina Cannon is a 52 year old patient with a one-year history of tingling involving the left hand and arm, discomfort in the arm and shoulder with some sensation of fatigue of both arms, left greater than right. The patient is being evaluated for these symptoms.  NERVE CONDUCTION STUDIES:  Nerve conduction studies were performed on both upper extremities. The distal motor latencies for the median nerves were prolonged on the left, normal on the right, and normal for the ulnar nerves bilaterally. The motor amplitudes for the median and ulnar nerves were normal bilaterally. The nerve conduction velocities for the median and ulnar nerves were normal bilaterally. The sensory latencies for the median nerves were prolonged on the left, normal on the right, and normal for the ulnar nerves bilaterally. The F wave latencies were within normal limits for the median and ulnar nerves bilaterally.  EMG STUDIES:  EMG study was performed on the left upper extremity:  The first dorsal interosseous muscle reveals 2 to 4 K units with full recruitment. No fibrillations or positive waves were noted. The abductor pollicis brevis muscle reveals 2 to 4 K units with full recruitment. No fibrillations or positive waves were noted. The extensor indicis proprius muscle reveals 1 to 3 K units with full recruitment. No fibrillations or positive waves were noted. The pronator teres muscle reveals 2 to 3 K units with full recruitment. No fibrillations or positive waves were noted. The biceps muscle reveals 1 to 2 K units with full recruitment. No fibrillations or positive waves were noted. The triceps muscle reveals 2 to 4 K units with full recruitment. No fibrillations or positive waves were noted. The anterior deltoid muscle reveals 2 to 3 K units with full recruitment. No fibrillations or positive waves were noted. The cervical paraspinal muscles were tested at 2 levels. No abnormalities of insertional  activity were seen at either level tested. There was good relaxation.  EMG study was performed on the right upper extremity:  The first dorsal interosseous muscle reveals 2 to 4 K units with full recruitment. No fibrillations or positive waves were noted. The abductor pollicis brevis muscle reveals 2 to 4 K units with full recruitment. No fibrillations or positive waves were noted. The extensor indicis proprius muscle reveals 1 to 3 K units with full recruitment. No fibrillations or positive waves were noted. The pronator teres muscle reveals 2 to 3 K units with full recruitment. No fibrillations or positive waves were noted. The biceps muscle reveals 1 to 2 K units with full recruitment. No fibrillations or positive waves were noted. The triceps muscle reveals 2 to 4 K units with full recruitment. No fibrillations or positive waves were noted. The anterior deltoid muscle reveals 2 to 3 K units with full recruitment. No fibrillations or positive waves were noted. The cervical paraspinal muscles were tested at 2 levels. No abnormalities of insertional activity were seen at either level tested. There was good relaxation.   IMPRESSION:  Nerve conduction studies done on both upper extremities reveals evidence of a mild left carpal tunnel syndrome. No other significant abnormalities were seen. EMG evaluation of both upper extremities were unremarkable, without evidence of an overlying cervical radiculopathy.   Marlan Palau MD 12/22/2012 4:38 PM  [facility] [address] [place], [place] [address]  Phone [phone] Fax [phone]

## 2014-04-22 ENCOUNTER — Other Ambulatory Visit: Payer: Self-pay | Admitting: Obstetrics and Gynecology

## 2014-04-23 LAB — CYTOLOGY - PAP

## 2014-04-25 ENCOUNTER — Other Ambulatory Visit: Payer: Self-pay | Admitting: Obstetrics and Gynecology

## 2014-04-25 DIAGNOSIS — R928 Other abnormal and inconclusive findings on diagnostic imaging of breast: Secondary | ICD-10-CM

## 2014-05-06 ENCOUNTER — Other Ambulatory Visit: Payer: Self-pay

## 2014-05-06 ENCOUNTER — Inpatient Hospital Stay: Admission: RE | Admit: 2014-05-06 | Payer: Self-pay | Source: Ambulatory Visit

## 2014-05-20 ENCOUNTER — Other Ambulatory Visit: Payer: Self-pay | Admitting: Obstetrics and Gynecology

## 2014-05-20 ENCOUNTER — Ambulatory Visit
Admission: RE | Admit: 2014-05-20 | Discharge: 2014-05-20 | Disposition: A | Discharge status: Home / Self Care | Payer: BLUE CROSS/BLUE SHIELD | Source: Ambulatory Visit | Attending: Obstetrics and Gynecology | Admitting: Obstetrics and Gynecology

## 2014-05-20 DIAGNOSIS — R928 Other abnormal and inconclusive findings on diagnostic imaging of breast: Secondary | ICD-10-CM

## 2014-12-05 ENCOUNTER — Other Ambulatory Visit: Payer: Self-pay | Admitting: Obstetrics and Gynecology

## 2014-12-05 DIAGNOSIS — R921 Mammographic calcification found on diagnostic imaging of breast: Secondary | ICD-10-CM

## 2014-12-16 ENCOUNTER — Other Ambulatory Visit: Payer: Self-pay | Admitting: Obstetrics and Gynecology

## 2014-12-16 ENCOUNTER — Other Ambulatory Visit: Payer: BLUE CROSS/BLUE SHIELD

## 2014-12-16 ENCOUNTER — Ambulatory Visit
Admission: RE | Admit: 2014-12-16 | Discharge: 2014-12-16 | Disposition: A | Discharge status: Home / Self Care | Payer: 59 | Source: Ambulatory Visit | Attending: Obstetrics and Gynecology | Admitting: Obstetrics and Gynecology

## 2014-12-16 DIAGNOSIS — R921 Mammographic calcification found on diagnostic imaging of breast: Secondary | ICD-10-CM

## 2014-12-24 ENCOUNTER — Other Ambulatory Visit: Payer: Self-pay | Admitting: Obstetrics and Gynecology

## 2014-12-24 DIAGNOSIS — R921 Mammographic calcification found on diagnostic imaging of breast: Secondary | ICD-10-CM

## 2014-12-25 ENCOUNTER — Ambulatory Visit
Admission: RE | Admit: 2014-12-25 | Discharge: 2014-12-25 | Disposition: A | Discharge status: Home / Self Care | Payer: 59 | Source: Ambulatory Visit | Attending: Obstetrics and Gynecology | Admitting: Obstetrics and Gynecology

## 2014-12-25 DIAGNOSIS — R921 Mammographic calcification found on diagnostic imaging of breast: Secondary | ICD-10-CM

## 2015-09-16 ENCOUNTER — Other Ambulatory Visit: Payer: Self-pay | Admitting: Obstetrics and Gynecology

## 2015-09-16 DIAGNOSIS — R921 Mammographic calcification found on diagnostic imaging of breast: Secondary | ICD-10-CM

## 2016-03-29 ENCOUNTER — Ambulatory Visit
Admission: RE | Admit: 2016-03-29 | Discharge: 2016-03-29 | Disposition: A | Discharge status: Home / Self Care | Payer: 59 | Source: Ambulatory Visit | Attending: Obstetrics and Gynecology | Admitting: Obstetrics and Gynecology

## 2016-03-29 DIAGNOSIS — R922 Inconclusive mammogram: Secondary | ICD-10-CM | POA: Diagnosis not present

## 2016-03-29 DIAGNOSIS — R921 Mammographic calcification found on diagnostic imaging of breast: Secondary | ICD-10-CM

## 2016-07-09 DIAGNOSIS — L570 Actinic keratosis: Secondary | ICD-10-CM | POA: Diagnosis not present

## 2016-07-09 DIAGNOSIS — L720 Epidermal cyst: Secondary | ICD-10-CM | POA: Diagnosis not present

## 2016-07-09 DIAGNOSIS — L814 Other melanin hyperpigmentation: Secondary | ICD-10-CM | POA: Diagnosis not present

## 2016-07-09 DIAGNOSIS — L821 Other seborrheic keratosis: Secondary | ICD-10-CM | POA: Diagnosis not present

## 2016-07-19 DIAGNOSIS — H40013 Open angle with borderline findings, low risk, bilateral: Secondary | ICD-10-CM | POA: Diagnosis not present

## 2016-07-19 DIAGNOSIS — H11153 Pinguecula, bilateral: Secondary | ICD-10-CM | POA: Diagnosis not present

## 2016-11-27 DIAGNOSIS — H18831 Recurrent erosion of cornea, right eye: Secondary | ICD-10-CM | POA: Diagnosis not present

## 2016-11-29 DIAGNOSIS — H18831 Recurrent erosion of cornea, right eye: Secondary | ICD-10-CM | POA: Diagnosis not present

## 2016-12-02 DIAGNOSIS — H18831 Recurrent erosion of cornea, right eye: Secondary | ICD-10-CM | POA: Diagnosis not present

## 2016-12-13 DIAGNOSIS — Z13820 Encounter for screening for osteoporosis: Secondary | ICD-10-CM | POA: Diagnosis not present

## 2016-12-13 DIAGNOSIS — Z01419 Encounter for gynecological examination (general) (routine) without abnormal findings: Secondary | ICD-10-CM | POA: Diagnosis not present

## 2017-01-25 DIAGNOSIS — S93491A Sprain of other ligament of right ankle, initial encounter: Secondary | ICD-10-CM | POA: Diagnosis not present

## 2017-02-09 DIAGNOSIS — Z0000 Encounter for general adult medical examination without abnormal findings: Secondary | ICD-10-CM | POA: Diagnosis not present

## 2017-02-18 DIAGNOSIS — Z0000 Encounter for general adult medical examination without abnormal findings: Secondary | ICD-10-CM | POA: Diagnosis not present

## 2017-02-18 DIAGNOSIS — G5603 Carpal tunnel syndrome, bilateral upper limbs: Secondary | ICD-10-CM | POA: Diagnosis not present

## 2017-02-18 DIAGNOSIS — R29898 Other symptoms and signs involving the musculoskeletal system: Secondary | ICD-10-CM | POA: Diagnosis not present

## 2017-02-18 DIAGNOSIS — M542 Cervicalgia: Secondary | ICD-10-CM | POA: Diagnosis not present

## 2017-05-17 DIAGNOSIS — J029 Acute pharyngitis, unspecified: Secondary | ICD-10-CM | POA: Diagnosis not present

## 2017-05-17 DIAGNOSIS — J069 Acute upper respiratory infection, unspecified: Secondary | ICD-10-CM | POA: Diagnosis not present

## 2017-06-17 DIAGNOSIS — D1039 Benign neoplasm of other parts of mouth: Secondary | ICD-10-CM | POA: Diagnosis not present

## 2017-06-20 DIAGNOSIS — M50122 Cervical disc disorder at C5-C6 level with radiculopathy: Secondary | ICD-10-CM | POA: Diagnosis not present

## 2017-06-20 DIAGNOSIS — M50322 Other cervical disc degeneration at C5-C6 level: Secondary | ICD-10-CM | POA: Diagnosis not present

## 2017-06-20 DIAGNOSIS — M50323 Other cervical disc degeneration at C6-C7 level: Secondary | ICD-10-CM | POA: Diagnosis not present

## 2017-06-21 DIAGNOSIS — M50323 Other cervical disc degeneration at C6-C7 level: Secondary | ICD-10-CM | POA: Diagnosis not present

## 2017-06-21 DIAGNOSIS — M50122 Cervical disc disorder at C5-C6 level with radiculopathy: Secondary | ICD-10-CM | POA: Diagnosis not present

## 2017-06-21 DIAGNOSIS — M50322 Other cervical disc degeneration at C5-C6 level: Secondary | ICD-10-CM | POA: Diagnosis not present

## 2017-06-23 DIAGNOSIS — M50322 Other cervical disc degeneration at C5-C6 level: Secondary | ICD-10-CM | POA: Diagnosis not present

## 2017-06-23 DIAGNOSIS — M50122 Cervical disc disorder at C5-C6 level with radiculopathy: Secondary | ICD-10-CM | POA: Diagnosis not present

## 2017-06-23 DIAGNOSIS — M50323 Other cervical disc degeneration at C6-C7 level: Secondary | ICD-10-CM | POA: Diagnosis not present

## 2017-09-24 IMAGING — MG MM DIANOSTIC UNILATERAL R
4 series · 4 of 4 positions shown · non-contrast
Comparison: Previous exam(s).

CLINICAL DATA: Follow-up of probably benign right breast
calcifications.

EXAM:
DIGITAL DIAGNOSTIC RIGHT MAMMOGRAM

[R ML]
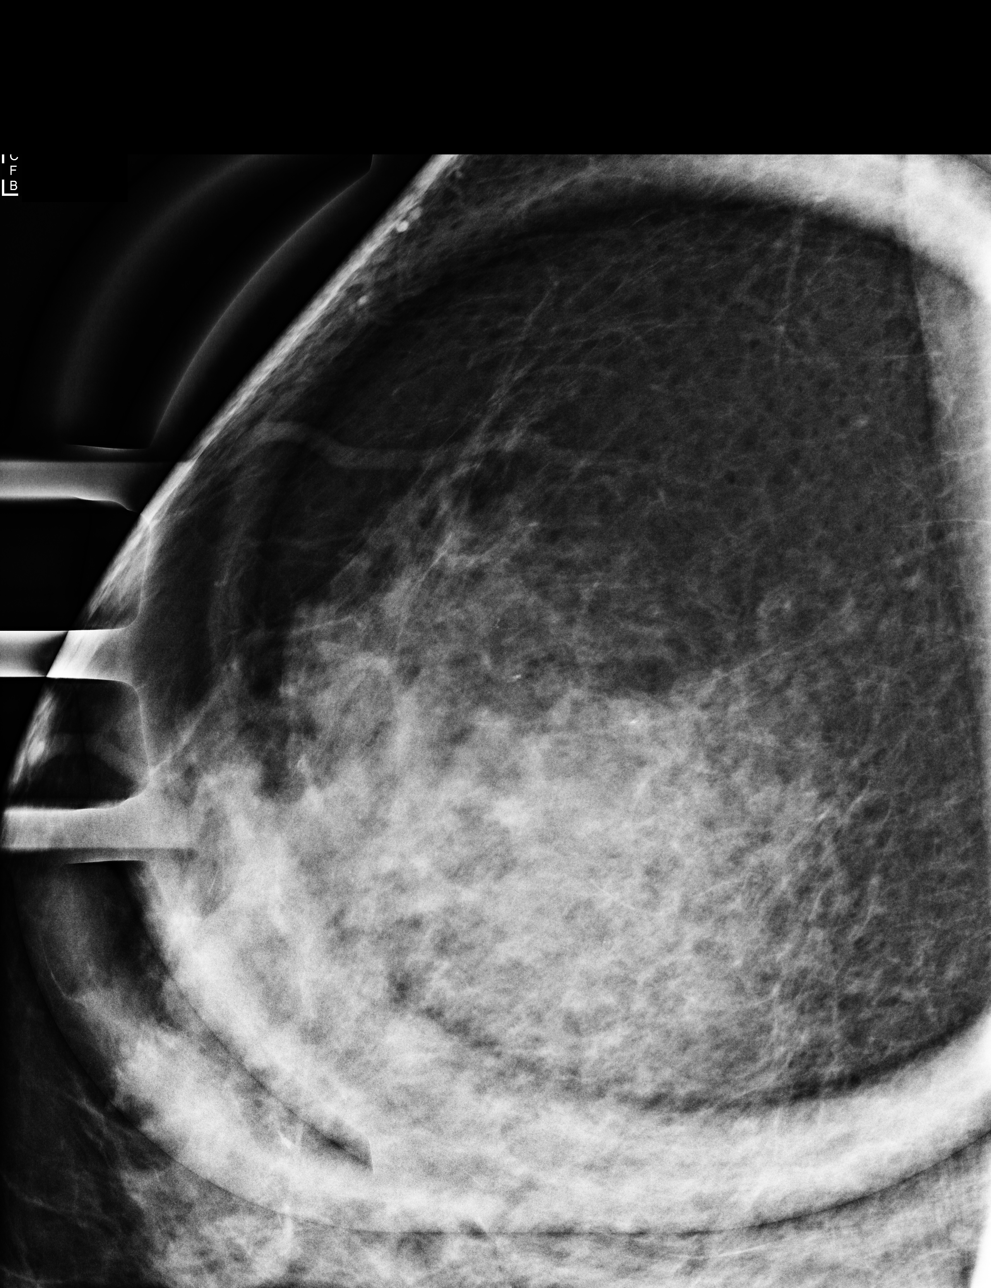

[R CC (1 of 2)]
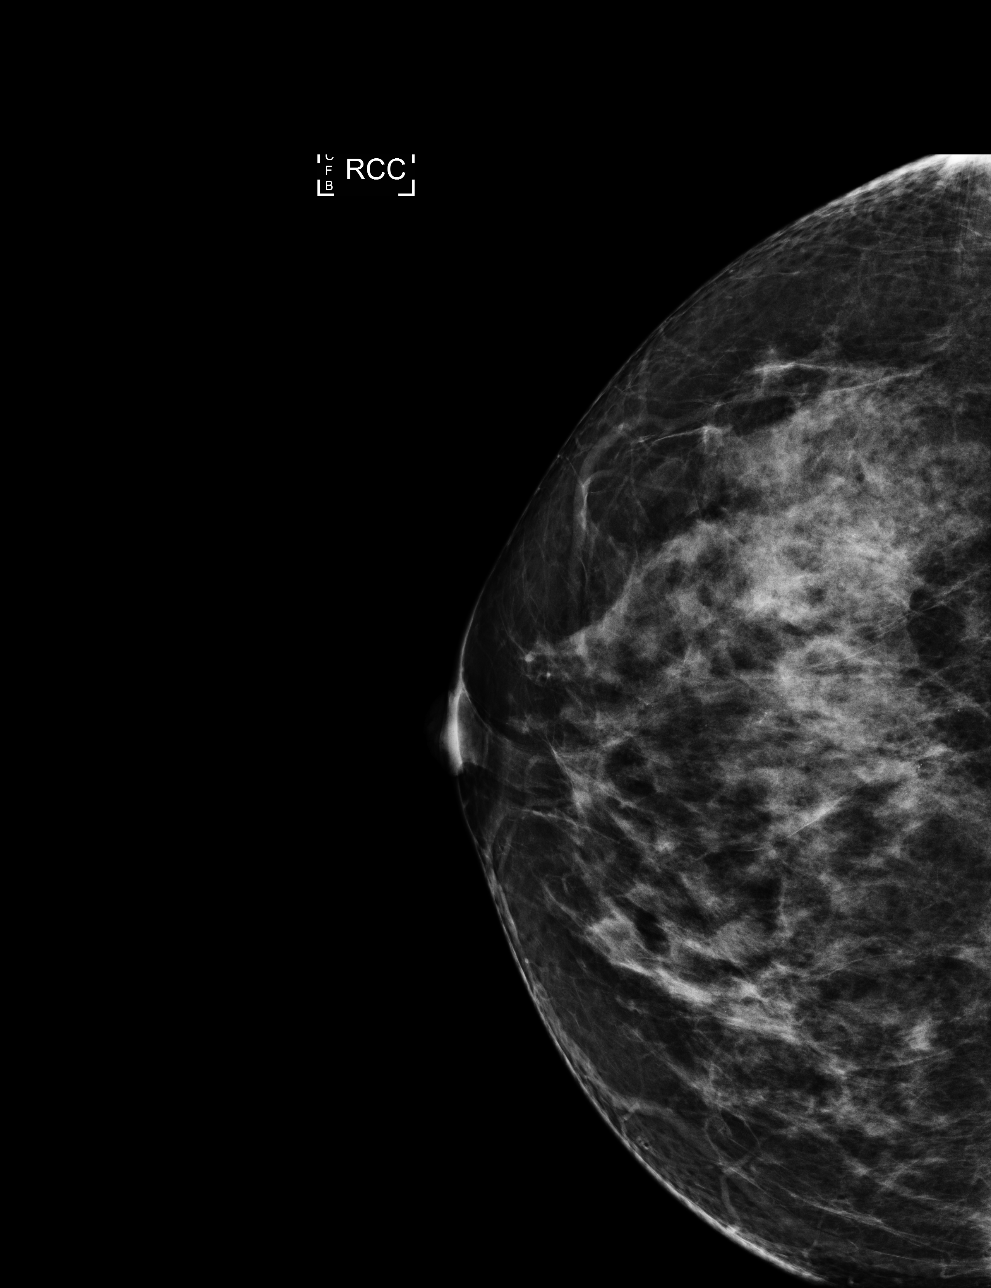

[R MLO]
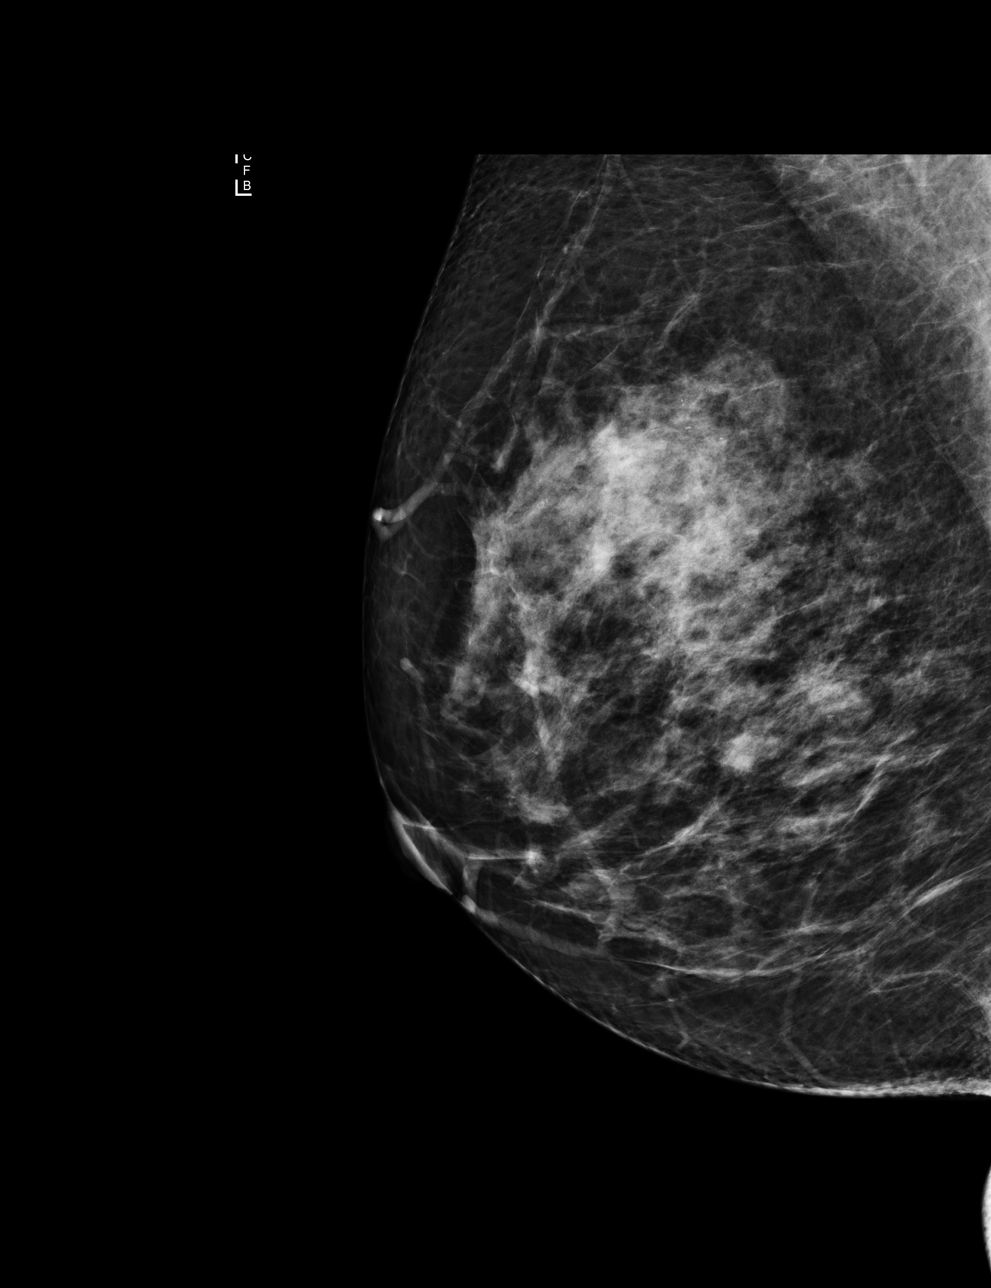

[R CC (2 of 2)]
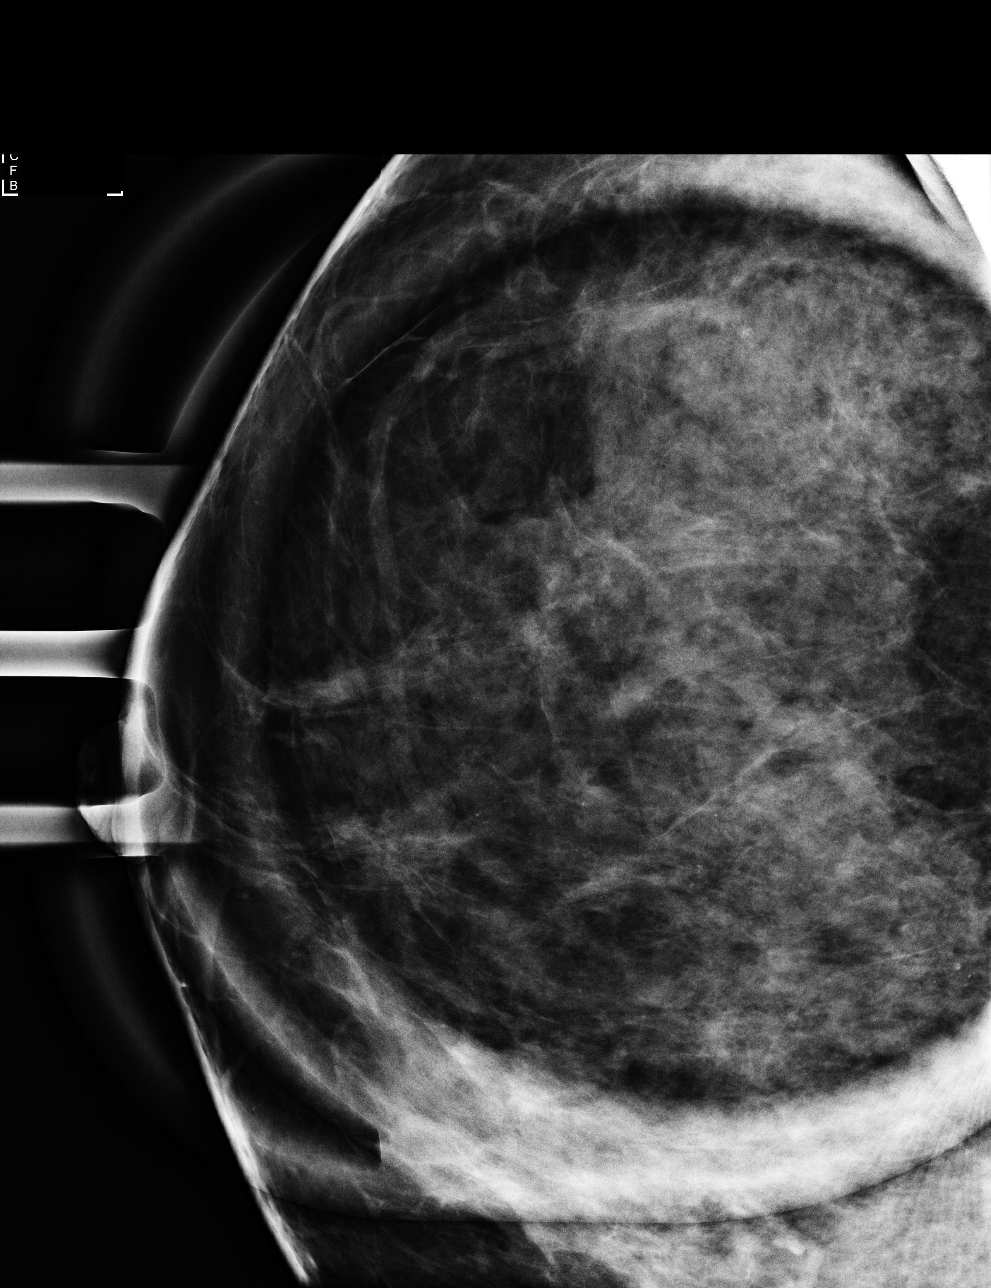

[4 of 4 positions shown; findings below may reference images not displayed]

ACR Breast Density Category c: The breast tissue is heterogeneously
dense, which may obscure small masses.
FINDINGS: There are no suspicious masses or areas of architectural distortion
in the right breast. There are however 3 groups of indeterminate
calcifications in the central and upper outer quadrant right breast,
middle and anterior depth.
IMPRESSION: Three groups of indeterminate calcifications in the central and
upper outer quadrant right breast, for which stereotactic core
needle biopsy is recommended.

RECOMMENDATION:
Stereotactic core needle biopsy of 1 of the groups of calcifications
in the right breast.

The patient will be scheduled at her convenience.

I have discussed the findings and recommendations with the patient.
Results were also provided in writing at the conclusion of the
visit. If applicable, a reminder letter will be sent to the patient
regarding the next appointment.

BI-RADS CATEGORY  4: Suspicious.

Dominga

## 2017-09-29 DIAGNOSIS — M50322 Other cervical disc degeneration at C5-C6 level: Secondary | ICD-10-CM | POA: Diagnosis not present

## 2017-09-29 DIAGNOSIS — M50323 Other cervical disc degeneration at C6-C7 level: Secondary | ICD-10-CM | POA: Diagnosis not present

## 2017-09-29 DIAGNOSIS — M50122 Cervical disc disorder at C5-C6 level with radiculopathy: Secondary | ICD-10-CM | POA: Diagnosis not present

## 2017-10-03 DIAGNOSIS — M50122 Cervical disc disorder at C5-C6 level with radiculopathy: Secondary | ICD-10-CM | POA: Diagnosis not present

## 2017-10-03 DIAGNOSIS — M50323 Other cervical disc degeneration at C6-C7 level: Secondary | ICD-10-CM | POA: Diagnosis not present

## 2017-10-03 DIAGNOSIS — M50322 Other cervical disc degeneration at C5-C6 level: Secondary | ICD-10-CM | POA: Diagnosis not present

## 2018-01-17 DIAGNOSIS — M50323 Other cervical disc degeneration at C6-C7 level: Secondary | ICD-10-CM | POA: Diagnosis not present

## 2018-01-17 DIAGNOSIS — M50122 Cervical disc disorder at C5-C6 level with radiculopathy: Secondary | ICD-10-CM | POA: Diagnosis not present

## 2018-01-17 DIAGNOSIS — M50322 Other cervical disc degeneration at C5-C6 level: Secondary | ICD-10-CM | POA: Diagnosis not present

## 2018-01-31 DIAGNOSIS — M50122 Cervical disc disorder at C5-C6 level with radiculopathy: Secondary | ICD-10-CM | POA: Diagnosis not present

## 2018-01-31 DIAGNOSIS — M50322 Other cervical disc degeneration at C5-C6 level: Secondary | ICD-10-CM | POA: Diagnosis not present

## 2018-01-31 DIAGNOSIS — M50323 Other cervical disc degeneration at C6-C7 level: Secondary | ICD-10-CM | POA: Diagnosis not present

## 2018-05-11 DIAGNOSIS — M50323 Other cervical disc degeneration at C6-C7 level: Secondary | ICD-10-CM | POA: Diagnosis not present

## 2018-05-11 DIAGNOSIS — M50322 Other cervical disc degeneration at C5-C6 level: Secondary | ICD-10-CM | POA: Diagnosis not present

## 2018-05-11 DIAGNOSIS — M50122 Cervical disc disorder at C5-C6 level with radiculopathy: Secondary | ICD-10-CM | POA: Diagnosis not present

## 2018-05-29 DIAGNOSIS — Z01419 Encounter for gynecological examination (general) (routine) without abnormal findings: Secondary | ICD-10-CM | POA: Diagnosis not present

## 2018-05-29 DIAGNOSIS — Z6829 Body mass index (BMI) 29.0-29.9, adult: Secondary | ICD-10-CM | POA: Diagnosis not present

## 2019-04-16 DIAGNOSIS — Z20822 Contact with and (suspected) exposure to covid-19: Secondary | ICD-10-CM | POA: Diagnosis not present

## 2019-05-16 DIAGNOSIS — R5383 Other fatigue: Secondary | ICD-10-CM | POA: Diagnosis not present

## 2019-05-16 DIAGNOSIS — U071 COVID-19: Secondary | ICD-10-CM | POA: Diagnosis not present

## 2019-05-16 DIAGNOSIS — R0602 Shortness of breath: Secondary | ICD-10-CM | POA: Diagnosis not present

## 2019-05-18 DIAGNOSIS — R071 Chest pain on breathing: Secondary | ICD-10-CM | POA: Diagnosis not present

## 2019-05-18 DIAGNOSIS — R072 Precordial pain: Secondary | ICD-10-CM | POA: Diagnosis not present

## 2019-05-18 DIAGNOSIS — G933 Postviral fatigue syndrome: Secondary | ICD-10-CM | POA: Diagnosis not present

## 2019-05-18 DIAGNOSIS — R079 Chest pain, unspecified: Secondary | ICD-10-CM | POA: Diagnosis not present

## 2019-05-21 DIAGNOSIS — Z0000 Encounter for general adult medical examination without abnormal findings: Secondary | ICD-10-CM | POA: Diagnosis not present

## 2019-05-28 DIAGNOSIS — E782 Mixed hyperlipidemia: Secondary | ICD-10-CM | POA: Diagnosis not present

## 2019-05-28 DIAGNOSIS — R7303 Prediabetes: Secondary | ICD-10-CM | POA: Diagnosis not present

## 2019-05-28 DIAGNOSIS — Z0000 Encounter for general adult medical examination without abnormal findings: Secondary | ICD-10-CM | POA: Diagnosis not present

## 2019-05-28 DIAGNOSIS — R945 Abnormal results of liver function studies: Secondary | ICD-10-CM | POA: Diagnosis not present

## 2019-05-28 DIAGNOSIS — G5603 Carpal tunnel syndrome, bilateral upper limbs: Secondary | ICD-10-CM | POA: Diagnosis not present

## 2019-07-03 DIAGNOSIS — M50322 Other cervical disc degeneration at C5-C6 level: Secondary | ICD-10-CM | POA: Diagnosis not present

## 2019-07-03 DIAGNOSIS — M9902 Segmental and somatic dysfunction of thoracic region: Secondary | ICD-10-CM | POA: Diagnosis not present

## 2019-07-03 DIAGNOSIS — M50122 Cervical disc disorder at C5-C6 level with radiculopathy: Secondary | ICD-10-CM | POA: Diagnosis not present

## 2019-07-03 DIAGNOSIS — M9901 Segmental and somatic dysfunction of cervical region: Secondary | ICD-10-CM | POA: Diagnosis not present

## 2019-07-03 DIAGNOSIS — M50323 Other cervical disc degeneration at C6-C7 level: Secondary | ICD-10-CM | POA: Diagnosis not present

## 2019-07-03 DIAGNOSIS — H18833 Recurrent erosion of cornea, bilateral: Secondary | ICD-10-CM | POA: Diagnosis not present

## 2019-07-18 ENCOUNTER — Encounter: Payer: Self-pay | Admitting: Neurology

## 2019-07-18 ENCOUNTER — Ambulatory Visit (INDEPENDENT_AMBULATORY_CARE_PROVIDER_SITE_OTHER): Payer: 59 | Admitting: Neurology

## 2019-07-18 DIAGNOSIS — G5603 Carpal tunnel syndrome, bilateral upper limbs: Secondary | ICD-10-CM

## 2019-07-18 HISTORY — DX: Carpal tunnel syndrome, bilateral upper limbs: G56.03

## 2019-07-18 NOTE — Procedures (Signed)
     HISTORY:  Tina Cannon is a 59 year old patient with a history of bilateral hand pain and numbness dating back about 10 years.  The symptoms have worsened significantly over the last several months.  She does have occasional neck pain without radiation down the arms.  She is being evaluated for a possible neuropathy or a cervical radiculopathy.  NERVE CONDUCTION STUDIES:  Nerve conduction studies were performed on both upper extremities.  The distal motor latencies for the median nerves were prolonged bilaterally and were normal for the ulnar nerves bilaterally.  The motor amplitudes for the median and ulnar nerves were normal bilaterally as were the nerve conduction velocities for these nerves.  The median sensory latencies were prolonged bilaterally but were normal for the radial nerves and for the ulnar nerves bilaterally.  The F-wave latencies for the ulnar nerves were within normal limits bilaterally.  EMG STUDIES:  EMG study was performed on the right upper extremity:  The first dorsal interosseous muscle reveals 2 to 4 K units with full recruitment. No fibrillations or positive waves were noted. The abductor pollicis brevis muscle reveals 2 to 4 K units with slightly reduced recruitment. No fibrillations or positive waves were noted.  Fast firing units were seen. The extensor indicis proprius muscle reveals 1 to 3 K units with full recruitment. No fibrillations or positive waves were noted. The pronator teres muscle reveals 2 to 3 K units with full recruitment. No fibrillations or positive waves were noted. The biceps muscle reveals 1 to 2 K units with full recruitment. No fibrillations or positive waves were noted. The triceps muscle reveals 2 to 4 K units with full recruitment. No fibrillations or positive waves were noted. The anterior deltoid muscle reveals 2 to 3 K units with full recruitment. No fibrillations or positive waves were noted. The cervical paraspinal muscles  were tested at 2 levels. No abnormalities of insertional activity were seen at either level tested. There was good relaxation.   IMPRESSION:  Nerve conduction studies done on both upper extremities shows evidence of mild bilateral carpal tunnel syndrome.  EMG evaluation of the right upper extremity was unremarkable without evidence of an overlying cervical radiculopathy.  Jill Alexanders MD 07/18/2019 4:28 PM  [facility] [address] [place], [place] [address]  Phone [phone] Fax [phone]

## 2019-07-18 NOTE — Progress Notes (Signed)
Please refer to EMG and nerve conduction procedure note.  

## 2019-07-18 NOTE — Progress Notes (Signed)
[place]    Nerve / Sites Muscle Latency Ref. Amplitude Ref. Rel Amp Segments Distance Velocity Ref. Area    ms ms mV mV %  cm m/s m/s mVms  L Median - APB     Wrist APB 4.6 ?4.4 6.7 ?4.0 100 Wrist - APB 7   25.4     Upper arm APB 8.6  6.6  97.4 Upper arm - Wrist 21 53 ?49 25.3  R Median - APB     Wrist APB 4.4 ?4.4 6.9 ?4.0 100 Wrist - APB 7   24.5     Upper arm APB 8.5  6.7  97.2 Upper arm - Wrist 21 51 ?49 24.7  L Ulnar - ADM     Wrist ADM 2.8 ?3.3 10.2 ?6.0 100 Wrist - ADM 7   40.3     B.Elbow ADM 5.8  9.9  97.3 B.Elbow - Wrist 18 62 ?49 39.4     A.Elbow ADM 7.5  9.9  99.6 A.Elbow - B.Elbow 10 58 ?49 39.3  R Ulnar - ADM     Wrist ADM 2.3 ?3.3 8.4 ?6.0 100 Wrist - ADM 7   31.6     B.Elbow ADM 5.4  8.6  103 B.Elbow - Wrist 18 58 ?49 28.1     A.Elbow ADM 7.0  8.5  98.9 A.Elbow - B.Elbow 10 62 ?49 29.9             SNC    Nerve / Sites Rec. Site Peak Lat Ref.  Amp Ref. Segments Distance    ms ms V V  cm  L Radial - Anatomical snuff box (Forearm)     Forearm Wrist 2.4 ?2.9 24 ?15 Forearm - Wrist 10  R Radial - Anatomical snuff box (Forearm)     Forearm Wrist 2.3 ?2.9 21 ?15 Forearm - Wrist 10  L Median - Orthodromic (Dig II, Mid palm)     Dig II Wrist 3.9 ?3.4 14 ?10 Dig II - Wrist 13  R Median - Orthodromic (Dig II, Mid palm)     Dig II Wrist 4.2 ?3.4 8 ?10 Dig II - Wrist 13  L Ulnar - Orthodromic, (Dig V, Mid palm)     Dig V Wrist 2.6 ?3.1 6 ?5 Dig V - Wrist 11  R Ulnar - Orthodromic, (Dig V, Mid palm)     Dig V Wrist 2.5 ?3.1 7 ?5 Dig V - Wrist 31                 F  Wave    Nerve F Lat Ref.   ms ms  L Ulnar - ADM 28.1 ?32.0  R Ulnar - ADM 29.1 ?32.0

## 2019-08-27 DIAGNOSIS — Z1231 Encounter for screening mammogram for malignant neoplasm of breast: Secondary | ICD-10-CM | POA: Diagnosis not present

## 2019-08-27 DIAGNOSIS — Z6831 Body mass index (BMI) 31.0-31.9, adult: Secondary | ICD-10-CM | POA: Diagnosis not present

## 2019-08-27 DIAGNOSIS — Z01419 Encounter for gynecological examination (general) (routine) without abnormal findings: Secondary | ICD-10-CM | POA: Diagnosis not present

## 2019-08-28 ENCOUNTER — Telehealth: Payer: Self-pay

## 2019-08-28 DIAGNOSIS — G5603 Carpal tunnel syndrome, bilateral upper limbs: Secondary | ICD-10-CM | POA: Diagnosis not present

## 2019-08-28 DIAGNOSIS — M180 Bilateral primary osteoarthritis of first carpometacarpal joints: Secondary | ICD-10-CM | POA: Diagnosis not present

## 2019-08-28 NOTE — Telephone Encounter (Signed)
Pt called in requesting recent EMG/NCS from April with Dr. Eugenie Birks be sent to Dr. Angus Palms office. Dr.  Iran Planas is with [facility] on [address].  I advised I would send message to medical records department to have these records sent.  Pt was verbalized appreciation.

## 2019-10-11 DIAGNOSIS — L820 Inflamed seborrheic keratosis: Secondary | ICD-10-CM | POA: Diagnosis not present

## 2019-10-11 DIAGNOSIS — L84 Corns and callosities: Secondary | ICD-10-CM | POA: Diagnosis not present

## 2019-10-11 DIAGNOSIS — B078 Other viral warts: Secondary | ICD-10-CM | POA: Diagnosis not present

## 2019-10-11 DIAGNOSIS — L570 Actinic keratosis: Secondary | ICD-10-CM | POA: Diagnosis not present

## 2019-10-11 DIAGNOSIS — L720 Epidermal cyst: Secondary | ICD-10-CM | POA: Diagnosis not present

## 2019-11-05 DIAGNOSIS — H18831 Recurrent erosion of cornea, right eye: Secondary | ICD-10-CM | POA: Diagnosis not present

## 2019-11-08 DIAGNOSIS — B070 Plantar wart: Secondary | ICD-10-CM | POA: Diagnosis not present

## 2019-11-08 DIAGNOSIS — L814 Other melanin hyperpigmentation: Secondary | ICD-10-CM | POA: Diagnosis not present

## 2019-11-08 DIAGNOSIS — L853 Xerosis cutis: Secondary | ICD-10-CM | POA: Diagnosis not present

## 2019-12-10 DIAGNOSIS — H18831 Recurrent erosion of cornea, right eye: Secondary | ICD-10-CM | POA: Diagnosis not present

## 2019-12-14 DIAGNOSIS — B070 Plantar wart: Secondary | ICD-10-CM | POA: Diagnosis not present

## 2020-01-03 DIAGNOSIS — G5603 Carpal tunnel syndrome, bilateral upper limbs: Secondary | ICD-10-CM | POA: Diagnosis not present

## 2020-02-07 DIAGNOSIS — G5603 Carpal tunnel syndrome, bilateral upper limbs: Secondary | ICD-10-CM | POA: Diagnosis not present

## 2020-02-29 DIAGNOSIS — G5602 Carpal tunnel syndrome, left upper limb: Secondary | ICD-10-CM | POA: Diagnosis not present

## 2020-03-13 DIAGNOSIS — G5602 Carpal tunnel syndrome, left upper limb: Secondary | ICD-10-CM | POA: Diagnosis not present

## 2020-03-13 DIAGNOSIS — G5601 Carpal tunnel syndrome, right upper limb: Secondary | ICD-10-CM | POA: Diagnosis not present

## 2020-03-13 DIAGNOSIS — Z4789 Encounter for other orthopedic aftercare: Secondary | ICD-10-CM | POA: Diagnosis not present

## 2020-03-13 DIAGNOSIS — M67441 Ganglion, right hand: Secondary | ICD-10-CM | POA: Diagnosis not present

## 2020-03-18 DIAGNOSIS — G5601 Carpal tunnel syndrome, right upper limb: Secondary | ICD-10-CM | POA: Diagnosis not present

## 2020-03-18 DIAGNOSIS — M67441 Ganglion, right hand: Secondary | ICD-10-CM | POA: Diagnosis not present

## 2020-03-28 DIAGNOSIS — Z4789 Encounter for other orthopedic aftercare: Secondary | ICD-10-CM | POA: Diagnosis not present

## 2020-03-28 DIAGNOSIS — G5601 Carpal tunnel syndrome, right upper limb: Secondary | ICD-10-CM | POA: Diagnosis not present

## 2020-03-28 DIAGNOSIS — M67441 Ganglion, right hand: Secondary | ICD-10-CM | POA: Diagnosis not present

## 2020-03-28 DIAGNOSIS — G5602 Carpal tunnel syndrome, left upper limb: Secondary | ICD-10-CM | POA: Diagnosis not present

## 2020-04-22 DIAGNOSIS — M25531 Pain in right wrist: Secondary | ICD-10-CM | POA: Diagnosis not present

## 2020-06-20 DIAGNOSIS — G5601 Carpal tunnel syndrome, right upper limb: Secondary | ICD-10-CM | POA: Diagnosis not present

## 2020-06-20 DIAGNOSIS — G5602 Carpal tunnel syndrome, left upper limb: Secondary | ICD-10-CM | POA: Diagnosis not present

## 2020-06-20 DIAGNOSIS — G5603 Carpal tunnel syndrome, bilateral upper limbs: Secondary | ICD-10-CM | POA: Diagnosis not present

## 2020-06-25 DIAGNOSIS — K130 Diseases of lips: Secondary | ICD-10-CM | POA: Diagnosis not present

## 2020-06-25 DIAGNOSIS — L720 Epidermal cyst: Secondary | ICD-10-CM | POA: Diagnosis not present

## 2020-07-02 DIAGNOSIS — G5601 Carpal tunnel syndrome, right upper limb: Secondary | ICD-10-CM | POA: Diagnosis not present

## 2020-07-11 DIAGNOSIS — G5601 Carpal tunnel syndrome, right upper limb: Secondary | ICD-10-CM | POA: Diagnosis not present

## 2020-07-11 DIAGNOSIS — M65312 Trigger thumb, left thumb: Secondary | ICD-10-CM | POA: Diagnosis not present

## 2020-07-30 DIAGNOSIS — E669 Obesity, unspecified: Secondary | ICD-10-CM | POA: Diagnosis not present

## 2020-07-30 DIAGNOSIS — Z13220 Encounter for screening for lipoid disorders: Secondary | ICD-10-CM | POA: Diagnosis not present

## 2020-07-30 DIAGNOSIS — Z1211 Encounter for screening for malignant neoplasm of colon: Secondary | ICD-10-CM | POA: Diagnosis not present

## 2020-07-30 DIAGNOSIS — R635 Abnormal weight gain: Secondary | ICD-10-CM | POA: Diagnosis not present

## 2020-07-30 DIAGNOSIS — Z0000 Encounter for general adult medical examination without abnormal findings: Secondary | ICD-10-CM | POA: Diagnosis not present

## 2020-07-30 DIAGNOSIS — G43009 Migraine without aura, not intractable, without status migrainosus: Secondary | ICD-10-CM | POA: Diagnosis not present

## 2021-01-26 DIAGNOSIS — Z6831 Body mass index (BMI) 31.0-31.9, adult: Secondary | ICD-10-CM | POA: Diagnosis not present

## 2021-01-26 DIAGNOSIS — Z79890 Hormone replacement therapy (postmenopausal): Secondary | ICD-10-CM | POA: Diagnosis not present

## 2021-01-26 DIAGNOSIS — Z1231 Encounter for screening mammogram for malignant neoplasm of breast: Secondary | ICD-10-CM | POA: Diagnosis not present

## 2021-01-26 DIAGNOSIS — Z01419 Encounter for gynecological examination (general) (routine) without abnormal findings: Secondary | ICD-10-CM | POA: Diagnosis not present

## 2021-02-15 ENCOUNTER — Emergency Department (HOSPITAL_BASED_OUTPATIENT_CLINIC_OR_DEPARTMENT_OTHER)
Admission: EM | Admit: 2021-02-15 | Discharge: 2021-02-15 | Disposition: A | Discharge status: Home / Self Care | Payer: 59 | Attending: Emergency Medicine | Admitting: Emergency Medicine

## 2021-02-15 ENCOUNTER — Emergency Department (HOSPITAL_BASED_OUTPATIENT_CLINIC_OR_DEPARTMENT_OTHER): Payer: 59

## 2021-02-15 ENCOUNTER — Other Ambulatory Visit: Payer: Self-pay

## 2021-02-15 ENCOUNTER — Encounter (HOSPITAL_BASED_OUTPATIENT_CLINIC_OR_DEPARTMENT_OTHER): Payer: Self-pay

## 2021-02-15 DIAGNOSIS — Z20822 Contact with and (suspected) exposure to covid-19: Secondary | ICD-10-CM | POA: Insufficient documentation

## 2021-02-15 DIAGNOSIS — Z5321 Procedure and treatment not carried out due to patient leaving prior to being seen by health care provider: Secondary | ICD-10-CM | POA: Diagnosis not present

## 2021-02-15 DIAGNOSIS — R1032 Left lower quadrant pain: Secondary | ICD-10-CM | POA: Insufficient documentation

## 2021-02-15 DIAGNOSIS — R059 Cough, unspecified: Secondary | ICD-10-CM | POA: Diagnosis not present

## 2021-02-15 LAB — RESP PANEL BY RT-PCR (FLU A&B, COVID) ARPGX2
Influenza A by PCR: NEGATIVE
Influenza B by PCR: NEGATIVE
SARS Coronavirus 2 by RT PCR: NEGATIVE

## 2021-02-15 NOTE — ED Notes (Signed)
Called for PT in lobby, no response.

## 2021-02-15 NOTE — ED Triage Notes (Signed)
Llq pain when coughing

## 2021-02-15 NOTE — ED Triage Notes (Signed)
Dry cough x2 weeks

## 2021-02-15 NOTE — ED Provider Notes (Signed)
Pt was not in room when I tried to evaluate her. I did not establish care with her.   Rodney Booze, PA-C 02/15/21 2050    Margette Fast, MD 02/16/21 1258

## 2021-02-18 DIAGNOSIS — J209 Acute bronchitis, unspecified: Secondary | ICD-10-CM | POA: Diagnosis not present

## 2021-06-08 DIAGNOSIS — K219 Gastro-esophageal reflux disease without esophagitis: Secondary | ICD-10-CM | POA: Diagnosis not present

## 2021-06-08 DIAGNOSIS — R0789 Other chest pain: Secondary | ICD-10-CM | POA: Diagnosis not present

## 2021-06-08 DIAGNOSIS — M5431 Sciatica, right side: Secondary | ICD-10-CM | POA: Diagnosis not present

## 2021-06-08 DIAGNOSIS — E669 Obesity, unspecified: Secondary | ICD-10-CM | POA: Diagnosis not present

## 2021-06-08 DIAGNOSIS — Z79890 Hormone replacement therapy (postmenopausal): Secondary | ICD-10-CM | POA: Diagnosis not present

## 2021-06-08 DIAGNOSIS — M19042 Primary osteoarthritis, left hand: Secondary | ICD-10-CM | POA: Diagnosis not present

## 2021-06-08 DIAGNOSIS — G8929 Other chronic pain: Secondary | ICD-10-CM | POA: Diagnosis not present

## 2021-06-08 DIAGNOSIS — R030 Elevated blood-pressure reading, without diagnosis of hypertension: Secondary | ICD-10-CM | POA: Diagnosis not present

## 2021-06-08 DIAGNOSIS — M545 Low back pain, unspecified: Secondary | ICD-10-CM | POA: Diagnosis not present

## 2021-06-08 DIAGNOSIS — M19041 Primary osteoarthritis, right hand: Secondary | ICD-10-CM | POA: Diagnosis not present

## 2021-06-15 DIAGNOSIS — R102 Pelvic and perineal pain: Secondary | ICD-10-CM | POA: Diagnosis not present

## 2021-07-10 DIAGNOSIS — R202 Paresthesia of skin: Secondary | ICD-10-CM | POA: Diagnosis not present

## 2021-07-10 DIAGNOSIS — I10 Essential (primary) hypertension: Secondary | ICD-10-CM | POA: Diagnosis not present

## 2021-07-10 DIAGNOSIS — R42 Dizziness and giddiness: Secondary | ICD-10-CM | POA: Diagnosis not present

## 2021-07-13 NOTE — Progress Notes (Signed)
?Cardiology Office Note:   ? ?Date:  07/14/2021  ? ?ID:  Tina Cannon, DOB [birth year], MRN [id number] ? ?PCP:  Tina Macadam, MD  ?Cardiologist:  None  ? ?Referring MD: Tina Seashore, MD  ? ?Chief Complaint  ?Patient presents with  ? Chest Pain  ? ? ?History of Present Illness:   ? ?Tina Cannon is a 61 y.o. female with a hx of chest discomfort and dyspnea referred for cardiac evaluation. ? ?Past h/o prediabetes mellitus, elevated BP without diagnosis of hypertension, and hyperlipidemia. ? ?For 4 to 5 months the patient has experienced subxiphoid cramping, at times excruciating discomfort.  She feels that it builds to a crescendo.  It started spontaneously and not exertion related.  There is no associated nausea or vomiting.  It does have a cramping quality.  The most recent episode occurred Friday p.m. while still at work.  She became very dizzy had some shortness of breath and felt that she may pass out.  It gradually faded away after several minutes. ? ?She was a former smoker years ago.  Family history of CHF (mother) and hypertension father and brother.  She is known to have prediabetes, elevated blood pressures without diagnosis of hypertension, and hyperlipidemia. ? ?Past Medical History:  ?Diagnosis Date  ? Bilateral carpal tunnel syndrome 07/18/2019  ? No pertinent past medical history   ? ? ?Past Surgical History:  ?Procedure Laterality Date  ? APPENDECTOMY  07/14/11  ? DILATION AND CURETTAGE OF UTERUS    ? cone bx at the same time  ? LAPAROSCOPIC APPENDECTOMY  07/14/2011  ? Procedure: APPENDECTOMY LAPAROSCOPIC;  Surgeon: Edward Jolly, MD;  Location: [facility];  Service: General;  Laterality: N/A;  ? ? ?Current Medications: ?Current Meds  ?Medication Sig  ? Acetaminophen (TYLENOL 8 HOUR PO) Take by mouth as needed.  ? B Complex-C (B-COMPLEX WITH VITAMIN C) tablet Take 1 tablet by mouth daily.  ? Cyanocobalamin (VITAMIN B12) 1000 MCG TBCR Take 1 tablet by mouth daily.  ? diphenhydrAMINE  (BENADRYL) 25 mg capsule Take 1 capsule by mouth daily.  ? [place] Ginseng 518 MG CAPS Take 1 capsule by mouth daily.  ? metoprolol tartrate (LOPRESSOR) 50 MG tablet Take 1 tablet (50 mg total) by mouth once for 1 dose. Take 2 hours prior to Cardiac CT  ? Multiple Vitamins-Minerals (MULTIVITAMIN WITH MINERALS) tablet Take 1 tablet by mouth daily.  ? Omega-3 Fatty Acids (FISH OIL) 1000 MG CAPS Take 1,000 mg by mouth daily.  ? rizatriptan (MAXALT) 10 MG tablet Take 10 mg by mouth daily.  ? traZODone (DESYREL) 50 MG tablet Take 50 mg by mouth at bedtime.  ?  ? ?Allergies:   Patient has no active allergies.  ? ?Social History  ? ?Socioeconomic History  ? Marital status: Divorced  ?  Spouse name: Not on file  ? Number of children: Not on file  ? Years of education: Not on file  ? Highest education level: Not on file  ?Occupational History  ? Not on file  ?Tobacco Use  ? Smoking status: Never  ? Smokeless tobacco: Never  ?Substance and Sexual Activity  ? Alcohol use: Yes  ?  Comment: 1 beer per day average  ? Drug use: No  ? Sexual activity: Not on file  ?Other Topics Concern  ? Not on file  ?Social History Narrative  ? Not on file  ? ?Social Determinants of Health  ? ?Financial Resource Strain: Not on file  ?Food Insecurity: Not  on file  ?Transportation Needs: Not on file  ?Physical Activity: Not on file  ?Stress: Not on file  ?Social Connections: Not on file  ?  ? ?Family History: ?The patient's family history includes Cancer in her father; Hypertension in her mother. ? ?ROS:   ?Please see the history of present illness.    ?He has her gallbladder.  No history of gallstones.  She is under stress caring for her mother who has dementia.  Her son who is in college was recently assaulted and has upcoming surgery because of a ruptured eardrum.  She was started on replacement hormone therapy in menopause and feels that many of the current symptoms correlate with starting that medication.  She has stopped taking the medications  and a Cannon of the symptoms have resolved.  The subxiphoid cramping discomfort however continues with a severe episode 4 days ago.  All other systems reviewed and are negative. ? ?EKGs/Labs/Other Studies Reviewed:   ? ?The following studies were reviewed today: ?No new data ? ?EKG:  EKG normal sinus rhythm with normal EKG ? ?Recent Labs: ?No results found for requested labs within last 8760 hours.  ?Recent Lipid Panel ?No results found for: CHOL, TRIG, HDL, CHOLHDL, VLDL, LDLCALC, LDLDIRECT ? ?Physical Exam:   ? ?VS:  BP 116/88   Pulse 69   Ht '5\' 7"'$  (1.702 m)   Wt 198 lb 9.6 oz (90.1 kg)   SpO2 96%   BMI 31.11 kg/m?    ? ?Wt Readings from Last 3 Encounters:  ?07/14/21 198 lb 9.6 oz (90.1 kg)  ?02/15/21 194 lb (88 kg)  ?08/12/11 172 lb (78 kg)  ?  ? ?GEN: Overweight. No acute distress ?HEENT: Normal ?NECK: No JVD. ?LYMPHATICS: No lymphadenopathy ?CARDIAC: No murmur. RRR no gallop, or edema. ?VASCULAR:  Normal Pulses. No bruits. ?RESPIRATORY:  Clear to auscultation without rales, wheezing or rhonchi  ?ABDOMEN: Soft, non-tender, non-distended, No pulsatile mass, ?MUSCULOSKELETAL: No deformity  ?SKIN: Warm and dry ?NEUROLOGIC:  Alert and oriented x 3 ?PSYCHIATRIC:  Normal affect  ? ?ASSESSMENT:   ? ?1. Precordial pain   ?2. Vasovagal syncope   ?3. Prediabetes   ?4. Hyperlipidemia LDL goal <70   ?5. Chest pain of uncertain etiology   ? ?PLAN:   ? ?In order of problems listed above: ? ?Uncertain etiology coronary CTA with FFR if indicated will be performed to exclude CAD.  The windows for this study may also help [place] to see if there are any stones in her gallbladder and to rule out an epigastric mass or pancreatic problem. ?Since early childhood, she has had multiple instances of near fainting that have neurally mediated qualities.  Some of the index complaint above sounds somewhat vagal.  It could have been in response to the severity of the pain. ?She understands this is a problem increases risk of ischemic heart  disease.  She is working on losing weight and exercising. ?Not currently on any therapy for hyperlipidemia with her recent LDL being 155 on June 08, 2021. ? ? ?Medication Adjustments/Labs and Tests Ordered: ?Current medicines are reviewed at length with the patient today.  Concerns regarding medicines are outlined above.  ?Orders Placed This Encounter  ?Procedures  ? CT CORONARY MORPH W/CTA COR W/SCORE W/CA W/CM &/OR WO/CM  ? Basic metabolic panel  ? EKG 12-Lead  ? ?Meds ordered this encounter  ?Medications  ? metoprolol tartrate (LOPRESSOR) 50 MG tablet  ?  Sig: Take 1 tablet (50 mg total) by mouth once  for 1 dose. Take 2 hours prior to Cardiac CT  ?  Dispense:  1 tablet  ?  Refill:  0  ? ? ?Patient Instructions  ?Medication Instructions:  ?Your physician recommends that you continue on your current medications as directed. Please refer to the Current Medication list given to you today.  ?*If you need a refill on your cardiac medications before your next appointment, please call your pharmacy* ? ? ?Lab Work: ?TODAY: BMP ?If you have labs (blood work) drawn today and your tests are completely normal, you will receive your results only by: ?MyChart Message (if you have MyChart) OR ?A paper copy in the mail ?If you have any lab test that is abnormal or we need to change your treatment, we will call you to review the results. ? ? ?Testing/Procedures: ?Your physician has requested that you have cardiac CT. Cardiac computed tomography (CT) is a painless test that uses an x-ray machine to take clear, detailed pictures of your heart. For further information please visit HugeFiesta.[email or web address]. Please follow instruction sheet as given. ?  ? ? ?Follow-Up: Based on results ?At [facility], you and your health needs are our priority.  As part of our continuing mission to provide you with exceptional heart care, we have created designated Provider Care Teams.  These Care Teams include your primary Cardiologist (physician) and  Advanced Practice Providers (APPs -  Physician Assistants and Nurse Practitioners) who all work together to provide you with the care you need, when you need it. ? ?We recommend signing up for the patient port

## 2021-07-14 ENCOUNTER — Encounter: Payer: Self-pay | Admitting: Interventional Cardiology

## 2021-07-14 ENCOUNTER — Ambulatory Visit (INDEPENDENT_AMBULATORY_CARE_PROVIDER_SITE_OTHER): Payer: 59 | Admitting: Interventional Cardiology

## 2021-07-14 VITALS — BP 116/88 | HR 69 | Ht 67.0 in | Wt 198.6 lb

## 2021-07-14 DIAGNOSIS — R7303 Prediabetes: Secondary | ICD-10-CM | POA: Diagnosis not present

## 2021-07-14 DIAGNOSIS — R072 Precordial pain: Secondary | ICD-10-CM

## 2021-07-14 DIAGNOSIS — R55 Syncope and collapse: Secondary | ICD-10-CM | POA: Diagnosis not present

## 2021-07-14 DIAGNOSIS — R079 Chest pain, unspecified: Secondary | ICD-10-CM

## 2021-07-14 DIAGNOSIS — E785 Hyperlipidemia, unspecified: Secondary | ICD-10-CM | POA: Diagnosis not present

## 2021-07-14 MED ORDER — METOPROLOL TARTRATE 50 MG PO TABS: 50.0000 mg | ORAL_TABLET | Freq: Once | ORAL | 0 refills | Status: DC

## 2021-07-14 NOTE — Patient Instructions (Addendum)
Medication Instructions:  ?Your physician recommends that you continue on your current medications as directed. Please refer to the Current Medication list given to you today.  ?*If you need a refill on your cardiac medications before your next appointment, please call your pharmacy* ? ? ?Lab Work: ?TODAY: BMP ?If you have labs (blood work) drawn today and your tests are completely normal, you will receive your results only by: ?MyChart Message (if you have MyChart) OR ?A paper copy in the mail ?If you have any lab test that is abnormal or we need to change your treatment, we will call you to review the results. ? ? ?Testing/Procedures: ?Your physician has requested that you have cardiac CT. Cardiac computed tomography (CT) is a painless test that uses an x-ray machine to take clear, detailed pictures of your heart. For further information please visit HugeFiesta.[email or web address]. Please follow instruction sheet as given. ?  ? ? ?Follow-Up: Based on results ?At [facility], you and your health needs are our priority.  As part of our continuing mission to provide you with exceptional heart care, we have created designated Provider Care Teams.  These Care Teams include your primary Cardiologist (physician) and Advanced Practice Providers (APPs -  Physician Assistants and Nurse Practitioners) who all work together to provide you with the care you need, when you need it. ? ?We recommend signing up for the patient portal called "MyChart".  Sign up information is provided on this After Visit Summary.  MyChart is used to connect with patients for Virtual Visits (Telemedicine).  Patients are able to view lab/test results, encounter notes, upcoming appointments, etc.  Non-urgent messages can be sent to your provider as well.   ?To learn more about what you can do with MyChart, go to [email or web address].   ? ? ?Provider:   ?Daneen Schick, MD  ? ? ?Other Instructions ? ? ?Your cardiac CT will be scheduled at the below location:   ? ?[facility] ?[address] ?[place], [place] [address] ?[phone] ? ?At [facility], please arrive at the [facility] and Children's Entrance (Entrance C2) of [facility] 30 minutes prior to test start time. ?You can use the FREE valet parking offered at entrance C (encouraged to control the heart rate for the test)  ?Proceed to the [facility] Radiology Department (first floor) to check-in and test prep. ? ?All radiology patients and guests should use entrance C2 at [facility], accessed from [facility], even though the hospital's physical address listed is [address] ? ? ? ? ?Please follow these instructions carefully (unless otherwise directed): ? ? ?On the Night Before the Test: ?Be sure to Drink plenty of water. ?Do not consume any caffeinated/decaffeinated beverages or chocolate 12 hours prior to your test. ?Do not take any antihistamines 12 hours prior to your test. ? ? ?On the Day of the Test: ?Drink plenty of water until 1 hour prior to the test. ?Do not eat any food 4 hours prior to the test. ?You may take your regular medications prior to the test.  ?Take metoprolol (Lopressor) 50 mg by mouth two hours prior to test. ?FEMALES- please wear underwire-free bra if available, avoid dresses & tight clothin ? ?     ?After the Test: ?Drink plenty of water. ?After receiving IV contrast, you may experience a mild flushed feeling. This is normal. ?On occasion, you may experience a mild rash up to 24 hours after the test. This is not dangerous.  If this occurs, you can take Benadryl 25 mg and increase your fluid intake. ?If you experience trouble breathing, this can be serious. If it is severe call 911 IMMEDIATELY. If it is mild, please call our office. ?If you take any of these medications: Glipizide/Metformin, Avandament, Glucavance, please do not take 48 hours after completing test unless otherwise instructed. ? ?We will call to schedule your test  2-4 weeks out understanding that some insurance companies will need an authorization prior to the service being performed.  ? ?For non-scheduling related questions, please contact the cardiac imaging nurse navigator should you have any questions/concerns: ?Marchia Bond, Cardiac Imaging Nurse Navigator ?Gordy Clement, Cardiac Imaging Nurse Navigator ?[facility] ?Direct Office Dial: [phone]  ? ?For scheduling needs, including cancellations and rescheduling, please call [place], [phone].  ? ?Important Information About Sugar ? ? ? ? ?  ?

## 2021-07-15 LAB — BASIC METABOLIC PANEL
BUN/Creatinine Ratio: 10 — ABNORMAL LOW (ref 12–28)
BUN: 10 mg/dL (ref 8–27)
CO2: 20 mmol/L (ref 20–29)
Calcium: 9.6 mg/dL (ref 8.7–10.3)
Chloride: 105 mmol/L (ref 96–106)
Creatinine, Ser: 0.98 mg/dL (ref 0.57–1.00)
Glucose: 92 mg/dL (ref 70–99)
Potassium: 4.2 mmol/L (ref 3.5–5.2)
Sodium: 142 mmol/L (ref 134–144)
eGFR: 66 mL/min/{1.73_m2} (ref >59)

## 2021-07-16 ENCOUNTER — Telehealth: Payer: Self-pay | Admitting: Interventional Cardiology

## 2021-07-16 NOTE — Telephone Encounter (Signed)
° °  Pt is returning call to get lab result °

## 2021-07-16 NOTE — Telephone Encounter (Signed)
Attempted to call patient back, went straight to VM. Left message for patient to return call to discuss lab results. ?

## 2021-07-17 NOTE — Telephone Encounter (Signed)
Spoke with patient and discussed lab results. ? ?Per Dr. Tamala Julian: ?Let the patient know the labs are perfect to proceed with contrast administration for coronary CT.  ? ?Patient verbalized understanding and expressed appreciation for follow-up. ? ?

## 2021-07-17 NOTE — Telephone Encounter (Signed)
Patient was returning a call for results. Please advise  ?

## 2021-07-27 DIAGNOSIS — K5730 Diverticulosis of large intestine without perforation or abscess without bleeding: Secondary | ICD-10-CM | POA: Diagnosis not present

## 2021-07-27 DIAGNOSIS — D120 Benign neoplasm of cecum: Secondary | ICD-10-CM | POA: Diagnosis not present

## 2021-07-27 DIAGNOSIS — D123 Benign neoplasm of transverse colon: Secondary | ICD-10-CM | POA: Diagnosis not present

## 2021-07-27 DIAGNOSIS — D125 Benign neoplasm of sigmoid colon: Secondary | ICD-10-CM | POA: Diagnosis not present

## 2021-07-27 DIAGNOSIS — K648 Other hemorrhoids: Secondary | ICD-10-CM | POA: Diagnosis not present

## 2021-07-27 DIAGNOSIS — Z1211 Encounter for screening for malignant neoplasm of colon: Secondary | ICD-10-CM | POA: Diagnosis not present

## 2021-07-29 DIAGNOSIS — D125 Benign neoplasm of sigmoid colon: Secondary | ICD-10-CM | POA: Diagnosis not present

## 2021-07-29 DIAGNOSIS — D123 Benign neoplasm of transverse colon: Secondary | ICD-10-CM | POA: Diagnosis not present

## 2021-07-29 DIAGNOSIS — D120 Benign neoplasm of cecum: Secondary | ICD-10-CM | POA: Diagnosis not present

## 2021-08-03 DIAGNOSIS — N950 Postmenopausal bleeding: Secondary | ICD-10-CM | POA: Diagnosis not present

## 2021-08-03 DIAGNOSIS — R9389 Abnormal findings on diagnostic imaging of other specified body structures: Secondary | ICD-10-CM | POA: Diagnosis not present

## 2021-08-07 ENCOUNTER — Telehealth (HOSPITAL_COMMUNITY): Payer: Self-pay | Admitting: Emergency Medicine

## 2021-08-07 NOTE — Telephone Encounter (Signed)
Attempted to call patient regarding upcoming cardiac CT appointment. °Left message on voicemail with name and callback number °Latonia Conrow RN Navigator Cardiac Imaging °[facility] °[phone] Office °[phone] Cell ° °

## 2021-08-11 ENCOUNTER — Ambulatory Visit (HOSPITAL_COMMUNITY)
Admission: RE | Admit: 2021-08-11 | Discharge: 2021-08-11 | Disposition: A | Discharge status: Home / Self Care | Payer: 59 | Source: Ambulatory Visit | Attending: Interventional Cardiology | Admitting: Interventional Cardiology

## 2021-08-11 DIAGNOSIS — R072 Precordial pain: Secondary | ICD-10-CM | POA: Insufficient documentation

## 2021-08-11 MED ORDER — NITROGLYCERIN 0.4 MG SL SUBL
SUBLINGUAL_TABLET | SUBLINGUAL | Status: AC
  Filled 2021-08-11: qty 2

## 2021-08-11 MED ORDER — NITROGLYCERIN 0.4 MG SL SUBL
0.8000 mg | SUBLINGUAL_TABLET | Freq: Once | SUBLINGUAL | Status: AC
  Administered 2021-08-11: 0.8 mg via SUBLINGUAL

## 2021-08-11 MED ORDER — IOHEXOL 350 MG/ML SOLN
100.0000 mL | Freq: Once | INTRAVENOUS | Status: AC | PRN
  Administered 2021-08-11: 100 mL via INTRAVENOUS

## 2021-08-19 ENCOUNTER — Ambulatory Visit: Payer: Self-pay | Admitting: General Surgery

## 2021-08-19 DIAGNOSIS — K641 Second degree hemorrhoids: Secondary | ICD-10-CM | POA: Diagnosis not present

## 2021-08-19 NOTE — H&P (Signed)
REFERRING PHYSICIAN:  Otis Brace, MD  PROVIDER:  Monico Blitz, MD  MRN: [id number] DOB: 06-01-1960 DATE OF ENCOUNTER: 08/19/2021  Subjective  Chief Complaint: New Consultation (Hemorrhoids )     History of Present Illness: Tina Cannon is a 61 y.o. female who is seen today as an office consultation at the request of Dr. Alessandra Bevels for evaluation of New Consultation (Hemorrhoids ) .  Patient recently underwent colonoscopy which showed large internal and external hemorrhoids.  Patient states that she has had these for years.  They are mainly a hygiene issue due to difficulty cleaning after bowel movements.  She occasionally has some bleeding.  She occasionally has some soreness.  She reports regular bowel habits.  She denies any difficulty with incontinence or urgency at baseline.  She does have a history of forceps delivery and episiotomy due to a difficult child delivery approximately 20 years ago.   Review of Systems: A complete review of systems was obtained from the patient.  I have reviewed this information and discussed as appropriate with the patient.  See HPI as well for other ROS.   Medical History: History reviewed. No pertinent past medical history.  There is no problem list on file for this patient.   Past Surgical History: Procedure Laterality Date  APPENDECTOMY  2014    Allergies Allergen Reactions  Levonorgestrel-Ethinyl Estrad Unknown  Oxycodone-Acetaminophen Other (See Comments)   Wild ideation and mild halluncinations   Current Outpatient Medications on File Prior to Visit Medication Sig Dispense Refill  acetaminophen (TYLENOL) 325 MG tablet 1 tablet as needed    cetirizine (ZYRTEC) 10 mg capsule Zyrtec 10 mg capsule    cyanocobalamin, vitamin B-12, 1,000 mcg TbER Take 1 tablet by mouth once daily    diphenhydrAMINE HCL 25 mg Chew Take 25 mg by mouth every 4 (four) hours    ibuprofen (MOTRIN) 200 MG tablet Take 200 mg by mouth  every 6 (six) hours as needed for Pain    melatonin 1 mg tablet Take by mouth    multivitamin with iron tablet Take by mouth    omega-3 acid ethyl esters (LOVAZA) 1 gram capsule Take 1,000 mg by mouth    rizatriptan (MAXALT) 10 MG tablet rizatriptan 10 mg tablet    No current facility-administered medications on file prior to visit.   History reviewed. No pertinent family history.   Social History  Tobacco Use Smoking Status Every Day  Types: Cigarettes Smokeless Tobacco Never    Social History  Socioeconomic History  Marital status: Divorced Tobacco Use  Smoking status: Every Day   Types: Cigarettes  Smokeless tobacco: Never Substance and Sexual Activity  Alcohol use: Yes  Drug use: Yes   Types: Marijuana   Objective:   There were no vitals filed for this visit.   Exam Gen: NAD Abd: soft  Rectal:    Labs, Imaging and Diagnostic Testing:  Procedure: Anoscopy Surgeon: Marcello Moores After the risks and benefits were explained, written consent was obtained for above procedure.  A medical assistant chaperone was present thoroughout the entire procedure.  Anesthesia: none Diagnosis: hemorrhoids Findings: Good rectal tone and squeeze pressure.  Large anterior skin tag, right anterior external hemorrhoid.  Grade 2 right posterior hemorrhoid and grade 2 larger right anterior hemorrhoid.  Assessment and Plan: Diagnoses and all orders for this visit:  Grade II hemorrhoids    Patient appears to have 1 large hemorrhoid that seems to be causing most of her symptoms.  She also has a large  skin tag.  I have recommended single column hemorrhoidectomy and excision of the anterior skin tag.  Patient may need a hemorrhoidopexy of the right posterior hemorrhoid which could be determined at the time of surgery.  We discussed the risk of the procedure which mainly include pain and bleeding.  There is a small risk of urinary retention acutely after surgery.  Recovery time is  approximately 4 to 6 weeks until she can get back to a job which requires sitting all day long.  Rosario Adie, MD Colon and Rectal Surgery [facility]

## 2022-04-19 ENCOUNTER — Other Ambulatory Visit: Payer: Self-pay | Admitting: Obstetrics and Gynecology

## 2022-04-19 DIAGNOSIS — R928 Other abnormal and inconclusive findings on diagnostic imaging of breast: Secondary | ICD-10-CM

## 2022-04-23 ENCOUNTER — Ambulatory Visit
Admission: RE | Admit: 2022-04-23 | Discharge: 2022-04-23 | Disposition: A | Discharge status: Home / Self Care | Payer: No Typology Code available for payment source | Source: Ambulatory Visit | Attending: Obstetrics and Gynecology | Admitting: Obstetrics and Gynecology

## 2022-04-23 DIAGNOSIS — R928 Other abnormal and inconclusive findings on diagnostic imaging of breast: Secondary | ICD-10-CM

## 2022-04-27 ENCOUNTER — Other Ambulatory Visit: Payer: Self-pay | Admitting: Obstetrics and Gynecology

## 2022-04-27 DIAGNOSIS — R921 Mammographic calcification found on diagnostic imaging of breast: Secondary | ICD-10-CM

## 2022-05-07 ENCOUNTER — Ambulatory Visit
Admission: RE | Admit: 2022-05-07 | Discharge: 2022-05-07 | Disposition: A | Discharge status: Home / Self Care | Payer: No Typology Code available for payment source | Source: Ambulatory Visit | Attending: Obstetrics and Gynecology | Admitting: Obstetrics and Gynecology

## 2022-05-07 DIAGNOSIS — R921 Mammographic calcification found on diagnostic imaging of breast: Secondary | ICD-10-CM

## 2022-05-07 HISTORY — PX: BREAST BIOPSY: SHX20

## 2022-07-13 ENCOUNTER — Ambulatory Visit: Payer: Self-pay | Admitting: General Surgery

## 2022-07-13 NOTE — H&P (View-Only) (Signed)
PROVIDER:  Oddie Kuhlmann CHRISTINE Tondra Reierson, MD  MRN: [id number] DOB: 03/21/1961 DATE OF ENCOUNTER: 07/13/2022  Subjective   Chief Complaint: No chief complaint on file.     History of Present Illness: .  Patient underwent colonoscopy in 2023, which showed large internal and external hemorrhoids.  Patient states that she has had these for years.  They are mainly a hygiene issue due to difficulty cleaning after bowel movements.  She occasionally has some bleeding.  She occasionally has some soreness.  She reports regular bowel habits.  She denies any difficulty with incontinence or urgency at baseline.  She does have a history of forceps delivery and episiotomy due to a difficult child delivery approximately 20 years ago.   Review of Systems: A complete review of systems was obtained from the patient.  I have reviewed this information and discussed as appropriate with the patient.  See HPI as well for other ROS.   Medical History: No past medical history on file.  There is no problem list on file for this patient.   Past Surgical History:  Procedure Laterality Date   APPENDECTOMY  2014     Allergies  Allergen Reactions   Levonorgestrel-Ethinyl Estrad Unknown   Oxycodone-Acetaminophen Other (See Comments)    Wild ideation and mild halluncinations    Current Outpatient Medications on File Prior to Visit  Medication Sig Dispense Refill   acetaminophen (TYLENOL) 325 MG tablet 1 tablet as needed     cetirizine (ZYRTEC) 10 mg capsule Zyrtec 10 mg capsule     cyanocobalamin, vitamin B-12, 1,000 mcg TbER Take 1 tablet by mouth once daily     diphenhydrAMINE HCL 25 mg Chew Take 25 mg by mouth every 4 (four) hours     ibuprofen (MOTRIN) 200 MG tablet Take 200 mg by mouth every 6 (six) hours as needed for Pain     melatonin 1 mg tablet Take by mouth     multivitamin with iron tablet Take by mouth     omega-3 acid ethyl esters (LOVAZA) 1 gram capsule Take 1,000 mg by mouth     rizatriptan  (MAXALT) 10 MG tablet rizatriptan 10 mg tablet     No current facility-administered medications on file prior to visit.    No family history on file.   Social History   Tobacco Use  Smoking Status Every Day   Types: Cigarettes  Smokeless Tobacco Never     Social History   Socioeconomic History   Marital status: Divorced  Tobacco Use   Smoking status: Every Day    Types: Cigarettes   Smokeless tobacco: Never  Substance and Sexual Activity   Alcohol use: Yes   Drug use: Yes    Types: Marijuana    Objective:    Exam Gen: NAD Abd: soft  Rectal: Large anterior skin tag   Labs, Imaging and Diagnostic Testing:  Procedure: Anoscopy Surgeon: Analiah Drum After the risks and benefits were explained, written consent was obtained for above procedure.  A medical assistant chaperone was present thoroughout the entire procedure.  Anesthesia: none Diagnosis: hemorrhoids Findings: Good rectal tone and squeeze pressure.  Large anterior skin tag, right anterior external hemorrhoid.  Grade 2 right posterior hemorrhoid and grade 2 larger right anterior hemorrhoid.  Assessment and Plan:  There are no diagnoses linked to this encounter.   Patient appears to have 1 large hemorrhoid that seems to be causing most of her symptoms.  She also has a large skin tag.  I have recommended single column   hemorrhoidectomy and excision of the anterior skin tag.  Patient may need a hemorrhoidopexy of the right posterior hemorrhoid which could be determined at the time of surgery.  We discussed the risk of the procedure which mainly include pain and bleeding.  There is a small risk of urinary retention acutely after surgery.  Recovery time is approximately 4 to 6 weeks until she can get back to a job which requires sitting all day long.  Dorean Hiebert C Neta Upadhyay, MD Colon and Rectal Surgery [facility]  

## 2022-07-13 NOTE — H&P (Signed)
PROVIDER:  Elenora Gamma, MD  MRN: [id number] DOB: [birth year] DATE OF ENCOUNTER: 07/13/2022  Subjective   Chief Complaint: No chief complaint on file.     History of Present Illness: .  Patient underwent colonoscopy in 2023, which showed large internal and external hemorrhoids.  Patient states that she has had these for years.  They are mainly a hygiene issue due to difficulty cleaning after bowel movements.  She occasionally has some bleeding.  She occasionally has some soreness.  She reports regular bowel habits.  She denies any difficulty with incontinence or urgency at baseline.  She does have a history of forceps delivery and episiotomy due to a difficult child delivery approximately 20 years ago.   Review of Systems: A complete review of systems was obtained from the patient.  I have reviewed this information and discussed as appropriate with the patient.  See HPI as well for other ROS.   Medical History: No past medical history on file.  There is no problem list on file for this patient.   Past Surgical History:  Procedure Laterality Date   APPENDECTOMY  2014     Allergies  Allergen Reactions   Levonorgestrel-Ethinyl Estrad Unknown   Oxycodone-Acetaminophen Other (See Comments)    Wild ideation and mild halluncinations    Current Outpatient Medications on File Prior to Visit  Medication Sig Dispense Refill   acetaminophen (TYLENOL) 325 MG tablet 1 tablet as needed     cetirizine (ZYRTEC) 10 mg capsule Zyrtec 10 mg capsule     cyanocobalamin, vitamin B-12, 1,000 mcg TbER Take 1 tablet by mouth once daily     diphenhydrAMINE HCL 25 mg Chew Take 25 mg by mouth every 4 (four) hours     ibuprofen (MOTRIN) 200 MG tablet Take 200 mg by mouth every 6 (six) hours as needed for Pain     melatonin 1 mg tablet Take by mouth     multivitamin with iron tablet Take by mouth     omega-3 acid ethyl esters (LOVAZA) 1 gram capsule Take 1,000 mg by mouth     rizatriptan  (MAXALT) 10 MG tablet rizatriptan 10 mg tablet     No current facility-administered medications on file prior to visit.    No family history on file.   Social History   Tobacco Use  Smoking Status Every Day   Types: Cigarettes  Smokeless Tobacco Never     Social History   Socioeconomic History   Marital status: Divorced  Tobacco Use   Smoking status: Every Day    Types: Cigarettes   Smokeless tobacco: Never  Substance and Sexual Activity   Alcohol use: Yes   Drug use: Yes    Types: Marijuana    Objective:    Exam Gen: NAD Abd: soft  Rectal: Large anterior skin tag   Labs, Imaging and Diagnostic Testing:  Procedure: Anoscopy Surgeon: Maisie Fus After the risks and benefits were explained, written consent was obtained for above procedure.  A medical assistant chaperone was present thoroughout the entire procedure.  Anesthesia: none Diagnosis: hemorrhoids Findings: Good rectal tone and squeeze pressure.  Large anterior skin tag, right anterior external hemorrhoid.  Grade 2 right posterior hemorrhoid and grade 2 larger right anterior hemorrhoid.  Assessment and Plan:  There are no diagnoses linked to this encounter.   Patient appears to have 1 large hemorrhoid that seems to be causing most of her symptoms.  She also has a large skin tag.  I have recommended single column  hemorrhoidectomy and excision of the anterior skin tag.  Patient may need a hemorrhoidopexy of the right posterior hemorrhoid which could be determined at the time of surgery.  We discussed the risk of the procedure which mainly include pain and bleeding.  There is a small risk of urinary retention acutely after surgery.  Recovery time is approximately 4 to 6 weeks until she can get back to a job which requires sitting all day long.  Vanita Panda, MD Colon and Rectal Surgery [facility]

## 2022-07-21 ENCOUNTER — Encounter (HOSPITAL_BASED_OUTPATIENT_CLINIC_OR_DEPARTMENT_OTHER): Payer: Self-pay | Admitting: General Surgery

## 2022-07-21 NOTE — Progress Notes (Signed)
Spoke w/ via phone for pre-op interview--- Lanora Manis Lab needs dos---- NONE              Lab results------ COVID test -----patient states asymptomatic no test needed Arrive at -------0745 NPO after MN NO Solid Food.   Med rec completed Medications to take morning of surgery -----NONE Diabetic medication ----- Patient instructed no nail polish to be worn day of surgery Patient instructed to bring photo id and insurance card day of surgery Patient aware to have Driver (ride ) / caregiver Sister [name]   for 24 hours after surgery  Patient Special Instructions ----- Pre-Op special Instructions ----- Patient verbalized understanding of instructions that were given at this phone interview. Patient denies shortness of breath, chest pain, fever, cough at this phone interview.

## 2022-08-04 ENCOUNTER — Encounter (HOSPITAL_BASED_OUTPATIENT_CLINIC_OR_DEPARTMENT_OTHER): Payer: Self-pay | Admitting: General Surgery

## 2022-08-04 ENCOUNTER — Encounter (HOSPITAL_BASED_OUTPATIENT_CLINIC_OR_DEPARTMENT_OTHER)
Admission: RE | Disposition: A | Discharge status: Home / Self Care | Payer: Self-pay | Source: Ambulatory Visit | Attending: General Surgery

## 2022-08-04 ENCOUNTER — Ambulatory Visit (HOSPITAL_BASED_OUTPATIENT_CLINIC_OR_DEPARTMENT_OTHER): Payer: No Typology Code available for payment source | Admitting: Certified Registered Nurse Anesthetist

## 2022-08-04 ENCOUNTER — Ambulatory Visit (HOSPITAL_BASED_OUTPATIENT_CLINIC_OR_DEPARTMENT_OTHER)
Admission: RE | Admit: 2022-08-04 | Discharge: 2022-08-04 | Disposition: A | Discharge status: Home / Self Care | Payer: No Typology Code available for payment source | Source: Ambulatory Visit | Attending: General Surgery | Admitting: General Surgery

## 2022-08-04 ENCOUNTER — Other Ambulatory Visit: Payer: Self-pay

## 2022-08-04 DIAGNOSIS — K644 Residual hemorrhoidal skin tags: Secondary | ICD-10-CM | POA: Insufficient documentation

## 2022-08-04 DIAGNOSIS — K641 Second degree hemorrhoids: Secondary | ICD-10-CM | POA: Diagnosis present

## 2022-08-04 DIAGNOSIS — E669 Obesity, unspecified: Secondary | ICD-10-CM

## 2022-08-04 DIAGNOSIS — Z6830 Body mass index (BMI) 30.0-30.9, adult: Secondary | ICD-10-CM

## 2022-08-04 HISTORY — DX: Headache, unspecified: R51.9

## 2022-08-04 HISTORY — PX: HEMORRHOID SURGERY: SHX153

## 2022-08-04 SURGERY — HEMORRHOIDECTOMY
Anesthesia: Monitor Anesthesia Care | Site: Rectum

## 2022-08-04 MED ORDER — SODIUM CHLORIDE 0.9% FLUSH: 3.0000 mL | Freq: Two times a day (BID) | INTRAVENOUS | Status: DC

## 2022-08-04 MED ORDER — ACETAMINOPHEN 500 MG PO TABS
1000.0000 mg | ORAL_TABLET | ORAL | Status: AC
  Administered 2022-08-04: 1000 mg via ORAL

## 2022-08-04 MED ORDER — ACETAMINOPHEN 500 MG PO TABS: 1000.0000 mg | ORAL_TABLET | Freq: Once | ORAL | Status: DC

## 2022-08-04 MED ORDER — BUPIVACAINE LIPOSOME 1.3 % IJ SUSP: 20.0000 mL | Freq: Once | INTRAMUSCULAR | Status: DC

## 2022-08-04 MED ORDER — OXYCODONE HCL 5 MG PO TABS: 5.0000 mg | ORAL_TABLET | Freq: Four times a day (QID) | ORAL | 0 refills | Status: AC | PRN

## 2022-08-04 MED ORDER — GABAPENTIN 300 MG PO CAPS
ORAL_CAPSULE | ORAL | Status: AC
  Filled 2022-08-04: qty 1

## 2022-08-04 MED ORDER — DEXMEDETOMIDINE HCL IN NACL 80 MCG/20ML IV SOLN
INTRAVENOUS | Status: DC | PRN
  Administered 2022-08-04: 8 ug via INTRAVENOUS

## 2022-08-04 MED ORDER — GABAPENTIN 300 MG PO CAPS
300.0000 mg | ORAL_CAPSULE | ORAL | Status: AC
  Administered 2022-08-04: 300 mg via ORAL

## 2022-08-04 MED ORDER — PROPOFOL 500 MG/50ML IV EMUL
INTRAVENOUS | Status: DC | PRN
  Administered 2022-08-04: 200 ug/kg/min via INTRAVENOUS

## 2022-08-04 MED ORDER — LACTATED RINGERS IV SOLN: INTRAVENOUS | Status: DC

## 2022-08-04 MED ORDER — MIDAZOLAM HCL 2 MG/2ML IJ SOLN
INTRAMUSCULAR | Status: DC | PRN
  Administered 2022-08-04: 2 mg via INTRAVENOUS

## 2022-08-04 MED ORDER — MIDAZOLAM HCL 2 MG/2ML IJ SOLN
INTRAMUSCULAR | Status: AC
  Filled 2022-08-04: qty 2

## 2022-08-04 MED ORDER — LACTATED RINGERS IV SOLN: INTRAVENOUS | Status: DC | PRN

## 2022-08-04 MED ORDER — ONDANSETRON HCL 4 MG/2ML IJ SOLN
INTRAMUSCULAR | Status: DC | PRN
Start: 2022-08-04 — End: 2022-08-04
  Administered 2022-08-04: 4 mg via INTRAVENOUS

## 2022-08-04 MED ORDER — GLYCOPYRROLATE 0.2 MG/ML IJ SOLN
INTRAMUSCULAR | Status: DC | PRN
  Administered 2022-08-04: .1 mg via INTRAVENOUS

## 2022-08-04 MED ORDER — FENTANYL CITRATE (PF) 100 MCG/2ML IJ SOLN
INTRAMUSCULAR | Status: AC
  Filled 2022-08-04: qty 2

## 2022-08-04 MED ORDER — BUPIVACAINE LIPOSOME 1.3 % IJ SUSP
INTRAMUSCULAR | Status: DC | PRN
  Administered 2022-08-04: 20 mL

## 2022-08-04 MED ORDER — CELECOXIB 200 MG PO CAPS
200.0000 mg | ORAL_CAPSULE | ORAL | Status: AC
  Administered 2022-08-04: 200 mg via ORAL

## 2022-08-04 MED ORDER — CELECOXIB 200 MG PO CAPS
ORAL_CAPSULE | ORAL | Status: AC
  Filled 2022-08-04: qty 1

## 2022-08-04 MED ORDER — 0.9 % SODIUM CHLORIDE (POUR BTL) OPTIME
TOPICAL | Status: DC | PRN
  Administered 2022-08-04: 500 mL

## 2022-08-04 MED ORDER — BUPIVACAINE-EPINEPHRINE 0.5% -1:200000 IJ SOLN
INTRAMUSCULAR | Status: DC | PRN
  Administered 2022-08-04: 30 mL

## 2022-08-04 MED ORDER — FENTANYL CITRATE (PF) 250 MCG/5ML IJ SOLN
INTRAMUSCULAR | Status: DC | PRN
  Administered 2022-08-04 (×2): 25 ug via INTRAVENOUS

## 2022-08-04 MED ORDER — ACETAMINOPHEN 500 MG PO TABS
ORAL_TABLET | ORAL | Status: AC
  Filled 2022-08-04: qty 2

## 2022-08-04 SURGICAL SUPPLY — 40 items
BLADE EXTENDED COATED 6.5IN (ELECTRODE) IMPLANT
BLADE SURG 10 STRL SS (BLADE) IMPLANT
BRIEF MESH DISP LRG (UNDERPADS AND DIAPERS) IMPLANT
COVER BACK TABLE 60X90IN (DRAPES) ×1 IMPLANT
COVER MAYO STAND STRL (DRAPES) ×1 IMPLANT
DRAPE HYSTEROSCOPY (MISCELLANEOUS) IMPLANT
DRAPE LAPAROTOMY 100X72 PEDS (DRAPES) ×1 IMPLANT
DRAPE SHEET LG 3/4 BI-LAMINATE (DRAPES) IMPLANT
DRAPE UTILITY XL STRL (DRAPES) ×1 IMPLANT
ELECT REM PT RETURN 9FT ADLT (ELECTROSURGICAL) ×1
ELECTRODE REM PT RTRN 9FT ADLT (ELECTROSURGICAL) ×1 IMPLANT
GAUZE 4X4 16PLY ~~LOC~~+RFID DBL (SPONGE) ×1 IMPLANT
GAUZE PAD ABD 8X10 STRL (GAUZE/BANDAGES/DRESSINGS) ×1 IMPLANT
GAUZE SPONGE 4X4 12PLY STRL (GAUZE/BANDAGES/DRESSINGS) IMPLANT
GLOVE BIO SURGEON STRL SZ 6.5 (GLOVE) ×1 IMPLANT
GLOVE INDICATOR 6.5 STRL GRN (GLOVE) ×1 IMPLANT
KIT SIGMOIDOSCOPE (SET/KITS/TRAYS/PACK) IMPLANT
KIT TURNOVER CYSTO (KITS) ×1 IMPLANT
LEGGING LITHOTOMY PAIR STRL (DRAPES) IMPLANT
NDL HYPO 22X1.5 SAFETY MO (MISCELLANEOUS) ×1 IMPLANT
NEEDLE HYPO 22X1.5 SAFETY MO (MISCELLANEOUS) ×1 IMPLANT
NS IRRIG 500ML POUR BTL (IV SOLUTION) ×1 IMPLANT
PACK BASIN DAY SURGERY FS (CUSTOM PROCEDURE TRAY) ×1 IMPLANT
PAD ARMBOARD 7.5X6 YLW CONV (MISCELLANEOUS) IMPLANT
PENCIL SMOKE EVACUATOR (MISCELLANEOUS) ×1 IMPLANT
SLEEVE SCD COMPRESS KNEE MED (STOCKING) ×1 IMPLANT
SPONGE HEMORRHOID 8X3CM (HEMOSTASIS) IMPLANT
SPONGE SURGIFOAM ABS GEL 100 (HEMOSTASIS) IMPLANT
SPONGE SURGIFOAM ABS GEL 12-7 (HEMOSTASIS) IMPLANT
SUT CHROMIC 2 0 SH (SUTURE) ×2 IMPLANT
SUT CHROMIC 3 0 SH 27 (SUTURE) ×2 IMPLANT
SUT VIC AB 2-0 SH 27 (SUTURE)
SUT VIC AB 2-0 SH 27XBRD (SUTURE) IMPLANT
SUT VIC AB 4-0 SH 18 (SUTURE) IMPLANT
SYR CONTROL 10ML LL (SYRINGE) ×1 IMPLANT
TOWEL OR 17X24 6PK STRL BLUE (TOWEL DISPOSABLE) ×1 IMPLANT
TRAY DSU PREP LF (CUSTOM PROCEDURE TRAY) ×1 IMPLANT
TUBE CONNECTING 12X1/4 (SUCTIONS) ×1 IMPLANT
WATER STERILE IRR 500ML POUR (IV SOLUTION) IMPLANT
YANKAUER SUCT BULB TIP NO VENT (SUCTIONS) ×1 IMPLANT

## 2022-08-04 NOTE — Interval H&P Note (Signed)
History and Physical Interval Note:  08/04/2022 8:18 AM  Tina Cannon  has presented today for surgery, with the diagnosis of HEMORRHOIDS.  The various methods of treatment have been discussed with the patient and family. After consideration of risks, benefits and other options for treatment, the patient has consented to  Procedure(s): HEMORRHOIDECTOMY, SINGLE COLUMN POSSIBLE HEMORRHOIDAL PEXY (N/A) as a surgical intervention.  The patient's history has been reviewed, patient examined, no change in status, stable for surgery.  I have reviewed the patient's chart and labs.  Questions were answered to the patient's satisfaction.     Vanita Panda, MD  Colorectal and General Surgery [facility]

## 2022-08-04 NOTE — Anesthesia Preprocedure Evaluation (Signed)
Anesthesia Evaluation  Patient identified by MRN, date of birth, ID band Patient awake    Reviewed: Allergy & Precautions, H&P , NPO status , Patient's Chart, lab work & pertinent test results  Airway Mallampati: II  TM Distance: >3 FB Neck ROM: Full    Dental no notable dental hx.    Pulmonary neg pulmonary ROS   Pulmonary exam normal breath sounds clear to auscultation       Cardiovascular negative cardio ROS Normal cardiovascular exam Rhythm:Regular Rate:Normal     Neuro/Psych  Headaches  negative psych ROS   GI/Hepatic negative GI ROS,,,(+)       marijuana use  Endo/Other  Obesity BMI 30  Renal/GU negative Renal ROS  negative genitourinary   Musculoskeletal negative musculoskeletal ROS (+)    Abdominal  (+) + obese  Peds negative pediatric ROS (+)  Hematology negative hematology ROS (+)   Anesthesia Other Findings   Reproductive/Obstetrics negative OB ROS                             Anesthesia Physical Anesthesia Plan  ASA: 2  Anesthesia Plan: MAC   Post-op Pain Management: Tylenol PO (pre-op)*   Induction:   PONV Risk Score and Plan: 2 and Propofol infusion, TIVA, Ondansetron, Midazolam and Treatment may vary due to age or medical condition  Airway Management Planned: Natural Airway and Simple Face Mask  Additional Equipment: None  Intra-op Plan:   Post-operative Plan:   Informed Consent: I have reviewed the patients History and Physical, chart, labs and discussed the procedure including the risks, benefits and alternatives for the proposed anesthesia with the patient or authorized representative who has indicated his/her understanding and acceptance.       Plan Discussed with: CRNA  Anesthesia Plan Comments:        Anesthesia Quick Evaluation

## 2022-08-04 NOTE — Anesthesia Procedure Notes (Signed)
Date/Time: 08/04/2022 9:52 AM  Performed by: Dairl Ponder, CRNAPre-anesthesia Checklist: Patient identified, Emergency Drugs available, Suction available, Patient being monitored and Timeout performed Patient Re-evaluated:Patient Re-evaluated prior to induction Induction Type: IV induction Comments: Optiflow

## 2022-08-04 NOTE — Transfer of Care (Signed)
Immediate Anesthesia Transfer of Care Note  Patient: Tina Cannon  Procedure(s) Performed: HEMORRHOIDECTOMY, SINGLE COLUMN and skin tag removal (Rectum)  Patient Location: PACU  Anesthesia Type:MAC  Level of Consciousness: drowsy and patient cooperative  Airway & Oxygen Therapy: Patient Spontanous Breathing and Patient connected to nasal cannula oxygen  Post-op Assessment: Report given to RN and Post -op Vital signs reviewed and stable  Post vital signs: Reviewed and stable  Last Vitals:  Vitals Value Taken Time  BP 131/77 08/04/22 1024  Temp    Pulse 99 08/04/22 1027  Resp 19 08/04/22 1027  SpO2 97 % 08/04/22 1027  Vitals shown include unvalidated device data.  Last Pain:  Vitals:   08/04/22 0740  TempSrc: Oral  PainSc: 0-No pain      Patients Stated Pain Goal: 2 (08/04/22 0740)  Complications: No notable events documented.

## 2022-08-04 NOTE — Op Note (Signed)
08/04/2022  10:21 AM  PATIENT:  Tina Cannon  62 y.o. female  Patient Care Team: Aliene Beams, MD as PCP - General (Family Medicine)  PRE-OPERATIVE DIAGNOSIS:  HEMORRHOIDS  POST-OPERATIVE DIAGNOSIS:  HEMORRHOIDS  PROCEDURE:  SINGLE COLUMN HEMORRHOIDECTOMY, SKIN TAG REMOVAL    Surgeon(s): Romie Levee, MD  ASSISTANT: none   ANESTHESIA:   local and MAC  SPECIMEN:  Source of Specimen:  R anterior hemorrhoid and skin tag  DISPOSITION OF SPECIMEN:  PATHOLOGY  COUNTS:  YES  PLAN OF CARE: Discharge to home after PACU  PATIENT DISPOSITION:  PACU - hemodynamically stable.  INDICATION: 62 year old female who presented to the office with complaints of external hemorrhoids with occasional bleeding.  On exam she had a large right anterior hemorrhoid that appeared to be inflamed as well as a chronically inflamed appearing anterior skin tag.   OR FINDINGS: Thickened anterior skin tag, grade 2 right anterior hemorrhoid  DESCRIPTION: the patient was identified in the preoperative holding area and taken to the OR where they were laid on the operating room table.  MAC anesthesia was induced without difficulty. The patient was then positioned in prone jackknife position with buttocks gently taped apart.  The patient was then prepped and draped in usual sterile fashion.  SCDs were noted to be in place prior to the initiation of anesthesia. A surgical timeout was performed indicating the correct patient, procedure, positioning and need for preoperative antibiotics.  A rectal block was performed using Marcaine with epinephrine mixed with Exparel.    I began with a digital rectal exam.  The patient had good sphincter tone.  I gently dilated to approximately 3 fingerbreadths.  I then placed a Hill-Ferguson anoscope into the anal canal and evaluated this completely.  The patient had a large grade 2 right anterior hemorrhoid with associated anterior skin tag.  There was no other major hemorrhoid  disease noted.  I excised the right anterior hemorrhoid using a scalpel on the anoderm.  Dissection was carried down to to the subcutaneous tissues and the sphincter complex and hemorrhoidal tissue were bluntly separated.  Once these were dissected free I removed the entire hemorrhoid using Metzenbaum scissors.  The distal rectum and anal canal mucosa was closed using a running 2-0 chromic suture.  The skin tag was removed on the anterior surface.  Both the skin tag and the external portion of the hemorrhoidectomy site were closed using 3-0 chromic sutures.  Additional Marcaine was placed around the incision sites for postoperative pain control.  A dressing was applied.  Hemostasis was good.  The patient was then awakened from anesthesia and sent to the postanesthesia care unit in stable condition.  All counts were correct per operating room staff.     Vanita Panda, MD  Colorectal and General Surgery [facility]

## 2022-08-04 NOTE — Discharge Instructions (Addendum)
ANORECTAL SURGERY: POST OP INSTRUCTIONS Take your usually prescribed home medications unless otherwise directed. DIET: During the first few hours after surgery sip on some liquids until you are able to urinate.  It is normal to not urinate for several hours after this surgery.  If you feel uncomfortable, please contact the office for instructions.  After you are able to urinate,you may eat, if you feel like it.  Follow a light bland diet the first 24 hours after arrival home, such as soup, liquids, crackers, etc.  Be sure to include lots of fluids daily (6-8 glasses).  Avoid fast food or heavy meals, as your are more likely to get nauseated.  Eat a low fat diet the next few days after surgery.  Limit caffeine intake to 1-2 servings a day. PAIN CONTROL: Pain is best controlled by a usual combination of several different methods TOGETHER: Muscle relaxation: Soak in a warm bath (or Sitz bath) three times a day and after bowel movements.  Continue to do this until all pain is resolved. Over the counter pain medication Prescription pain medication Most patients will experience some swelling and discomfort in the anus/rectal area and incisions.  Heat such as warm towels, sitz baths, warm baths, etc to help relax tight/sore spots and speed recovery.  Some people prefer to use ice, especially in the first couple days after surgery, as it may decrease the pain and swelling, or alternate between ice & heat.  Experiment to what works for you.  Swelling and bruising can take several weeks to resolve.  Pain can take even longer to completely resolve. It is helpful to take an over-the-counter pain medication regularly for the first few weeks.  Choose one of the following that works best for you: Naproxen (Aleve, etc)  Two 220mg tabs twice a day Ibuprofen (Advil, etc) Three 200mg tabs four times a day (every meal & bedtime) A  prescription for pain medication (such as percocet, oxycodone, hydrocodone, etc) should be  given to you upon discharge.  Take your pain medication as prescribed.  If you are having problems/concerns with the prescription medicine (does not control pain, nausea, vomiting, rash, itching, etc), please call us [phone] to see if we need to switch you to a different pain medicine that will work better for you and/or control your side effect better. If you need a refill on your pain medication, please contact your pharmacy.  They will contact our office to request authorization. Prescriptions will not be filled after 5 pm or on week-ends. KEEP YOUR BOWELS REGULAR and AVOID CONSTIPATION The goal is one to two soft bowel movements a day.  You should at least have a bowel movement every other day. Avoid getting constipated.  Between the surgery and the pain medications, it is common to experience some constipation. This can be very painful after rectal surgery.  Increasing fluid intake and taking a fiber supplement (such as Metamucil, Citrucel, FiberCon, etc) 1-2 times a day regularly will usually help prevent this problem from occurring.  A stool softener like colace is also recommended.  This can be purchased over the counter at your pharmacy.  You can take it up to 3 times a day.  If you do not have a bowel movement after 24 hrs since your surgery, take one does of milk of magnesia.  If you still haven't had a bowel movement 8-12 hours after that dose, take another dose.  If you don't have a bowel movement 48 hrs after surgery,   purchase a Fleets enema from the drug store and administer gently per package instructions.  If you still are having trouble with your bowel movements after that, please call the office for further instructions. If you develop diarrhea or have many loose bowel movements, simplify your diet to bland foods & liquids for a few days.  Stop any stool softeners and decrease your fiber supplement.  Switching to mild anti-diarrheal medications (Kayopectate, Pepto Bismol) can help.   If this worsens or does not improve, please call us.  Wound Care Remove your bandages before your first bowel movement or 8 hours after surgery.     Remove any wound packing material at this tim,e as well.  You do not need to repack the wound unless instructed otherwise.  Wear an absorbent pad or soft cotton gauze in your underwear to catch any drainage and help keep the area clean. You should change this every 2-3 hours while awake. Keep the area clean and dry.  Bathe / shower every day, especially after bowel movements.  Keep the area clean by showering / bathing over the incision / wound.   It is okay to soak an open wound to help wash it.  Wet wipes or showers / gentle washing after bowel movements is often less traumatic than regular toilet paper. You may have some styrofoam-like soft packing in the rectum which will come out with the first bowel movement.  You will often notice bleeding with bowel movements.  This should slow down by the end of the first week of surgery Expect some drainage.  This should slow down, too, by the end of the first week of surgery.  Wear an absorbent pad or soft cotton gauze in your underwear until the drainage stops. Do Not sit on a rubber or pillow ring.  This can make you symptoms worse.  You may sit on a soft pillow if needed.  ACTIVITIES as tolerated:   You may resume regular (light) daily activities beginning the next day--such as daily self-care, walking, climbing stairs--gradually increasing activities as tolerated.  If you can walk 30 minutes without difficulty, it is safe to try more intense activity such as jogging, treadmill, bicycling, low-impact aerobics, swimming, etc. Save the most intensive and strenuous activity for last such as sit-ups, heavy lifting, contact sports, etc  Refrain from any heavy lifting or straining until you are off narcotics for pain control.   You may drive when you are no longer taking prescription pain medication, you can  comfortably sit for long periods of time, and you can safely maneuver your car and apply brakes. You may have sexual intercourse when it is comfortable.  FOLLOW UP in our office Please call [facility] at [phone] to set up an appointment to see your surgeon in the office for a follow-up appointment approximately 3-4 weeks after your surgery. Make sure that you call for this appointment the day you arrive home to insure a convenient appointment time. 10. IF YOU HAVE DISABILITY OR FAMILY LEAVE FORMS, BRING THEM TO THE OFFICE FOR PROCESSING.  DO NOT GIVE THEM TO YOUR DOCTOR.     WHEN TO CALL us [phone]: Poor pain control Reactions / problems with new medications (rash/itching, nausea, etc)  Fever over 101.5 F (38.5 C) Inability to urinate Nausea and/or vomiting Worsening swelling or bruising Continued bleeding from incision. Increased pain, redness, or drainage from the incision  The clinic staff is available to answer your questions during regular business hours (8:30am-5pm).  Please don't hesitate to call and ask to speak to one of our nurses for clinical concerns.   A surgeon from [facility] is always on call at the hospitals   If you have a medical emergency, go to the nearest emergency room or call 911.    [facility] [address], [place], [place]  [address] ? MAIN: [phone] ? TOLL FREE: [phone] ? FAX [phone] [email or web address]         No acetaminophen/Tylenol until after 1:45 pm today if needed.  No ibuprofen, Advil, Aleve, Motrin, ketorolac, meloxicam, naproxen, or other NSAIDS until after 1:45 pm today if needed.   Post Anesthesia Home Care Instructions  Activity: Get plenty of rest for the remainder of the day. A responsible individual must stay with you for 24 hours following the procedure.  For the next 24 hours, DO NOT: -Drive a car -Advertising copywriter -Drink alcoholic  beverages -Take any medication unless instructed by your physician -Make any legal decisions or sign important papers.  Meals: Start with liquid foods such as gelatin or soup. Progress to regular foods as tolerated. Avoid greasy, spicy, heavy foods. If nausea and/or vomiting occur, drink only clear liquids until the nausea and/or vomiting subsides. Call your physician if vomiting continues.  Special Instructions/Symptoms: Your throat may feel dry or sore from the anesthesia or the breathing tube placed in your throat during surgery. If this causes discomfort, gargle with warm salt water. The discomfort should disappear within 24 hours.        Information for Discharge Teaching: EXPAREL (bupivacaine liposome injectable suspension)   Your surgeon or anesthesiologist gave you EXPAREL(bupivacaine) to help control your pain after surgery.  EXPAREL is a local anesthetic that provides pain relief by numbing the tissue around the surgical site. EXPAREL is designed to release pain medication over time and can control pain for up to 72 hours. Depending on how you respond to EXPAREL, you may require less pain medication during your recovery.  Possible side effects: Temporary loss of sensation or ability to move in the area where bupivacaine was injected. Nausea, vomiting, constipation Rarely, numbness and tingling in your mouth or lips, lightheadedness, or anxiety may occur. Call your doctor right away if you think you may be experiencing any of these sensations, or if you have other questions regarding possible side effects.  Follow all other discharge instructions given to you by your surgeon or nurse. Eat a healthy diet and drink plenty of water or other fluids.  If you return to the hospital for any reason within 96 hours following the administration of EXPAREL, it is important for health care providers to know that you have received this anesthetic. A teal colored band has been placed on your  arm with the date, time and amount of EXPAREL you have received in order to alert and inform your health care providers. Please leave this armband in place for the full 96 hours following administration, and then you may remove the band.

## 2022-08-04 NOTE — Anesthesia Postprocedure Evaluation (Signed)
Anesthesia Post Note  Patient: Trudie Buckler  Procedure(s) Performed: HEMORRHOIDECTOMY, SINGLE COLUMN and skin tag removal (Rectum)     Patient location during evaluation: PACU Anesthesia Type: MAC Level of consciousness: awake and alert Pain management: pain level controlled Vital Signs Assessment: post-procedure vital signs reviewed and stable Respiratory status: spontaneous breathing, nonlabored ventilation and respiratory function stable Cardiovascular status: blood pressure returned to baseline and stable Postop Assessment: no apparent nausea or vomiting Anesthetic complications: no   No notable events documented.  Last Vitals:  Vitals:   08/04/22 1030 08/04/22 1045  BP: 139/75 136/79  Pulse: 93 72  Resp: 12 15  Temp:    SpO2: 96% 98%    Last Pain:  Vitals:   08/04/22 1024  TempSrc:   PainSc: 0-No pain                 Lannie Fields

## 2022-08-05 LAB — SURGICAL PATHOLOGY

## 2022-08-06 ENCOUNTER — Encounter (HOSPITAL_BASED_OUTPATIENT_CLINIC_OR_DEPARTMENT_OTHER): Payer: Self-pay | Admitting: General Surgery

## 2022-08-06 NOTE — Progress Notes (Signed)
During post op phone call, pt sounded hoarse and difficult to hear. Stated "I have been coughing up bloody mucous, using saline gargles, and cough drops. Getting a little better." Denies chest pain. Chart review with Dr. Chaney Malling revealed MAC anesthesia with nasal cannula without endotracheal intubation. Instructed to continue with saline gargles, plenty of fluids, and if starts to feel worse go to ER. Follow up with Dr. Maisie Fus office on Monday.

## 2023-05-27 ENCOUNTER — Other Ambulatory Visit: Payer: Self-pay | Admitting: Obstetrics and Gynecology

## 2023-05-27 DIAGNOSIS — R921 Mammographic calcification found on diagnostic imaging of breast: Secondary | ICD-10-CM

## 2023-06-08 ENCOUNTER — Ambulatory Visit
Admission: RE | Admit: 2023-06-08 | Discharge: 2023-06-08 | Disposition: A | Discharge status: Home / Self Care | Source: Ambulatory Visit | Attending: Obstetrics and Gynecology | Admitting: Obstetrics and Gynecology

## 2023-06-08 DIAGNOSIS — R921 Mammographic calcification found on diagnostic imaging of breast: Secondary | ICD-10-CM

## 2023-09-26 ENCOUNTER — Other Ambulatory Visit (HOSPITAL_COMMUNITY): Payer: Self-pay | Admitting: Family Medicine

## 2023-09-26 DIAGNOSIS — E785 Hyperlipidemia, unspecified: Secondary | ICD-10-CM

## 2023-10-07 ENCOUNTER — Ambulatory Visit (HOSPITAL_COMMUNITY)
Admission: RE | Admit: 2023-10-07 | Discharge: 2023-10-07 | Disposition: A | Discharge status: Home / Self Care | Payer: Self-pay | Source: Ambulatory Visit | Attending: Family Medicine | Admitting: Family Medicine

## 2023-10-07 DIAGNOSIS — E785 Hyperlipidemia, unspecified: Secondary | ICD-10-CM | POA: Insufficient documentation

## 2024-04-27 ENCOUNTER — Other Ambulatory Visit: Payer: Self-pay | Admitting: Family Medicine

## 2024-04-27 ENCOUNTER — Other Ambulatory Visit: Payer: Self-pay | Admitting: Obstetrics and Gynecology

## 2024-04-27 DIAGNOSIS — R921 Mammographic calcification found on diagnostic imaging of breast: Secondary | ICD-10-CM

## 2024-05-20 IMAGING — CT CT HEART MORP W/ CTA COR W/ SCORE W/ CA W/CM &/OR W/O CM
4 of 7 series · 8 of 20 positions shown, 9 images · IV contrast (omnipaque)
Comparison: None Available.
COMPARISON: None Available.

Addendum:
EXAM:
OVER-READ INTERPRETATION  CT CHEST

The following report is a limited chest CT over-read performed by
This over-read does not include interpretation of cardiac or
coronary anatomy or pathology. The coronary CTA interpretation by
the cardiologist is attached.
HISTORY: Chest pain, nonspecific
Cardiac/Coronary CT
TECHNIQUE: The patient was scanned on a Siemens Force scanner.
PROTOCOL: A 100 kV prospective scan was triggered in the descending thoracic
aorta at 111 HU's. Axial non-contrast 3 mm slices were carried out
through the heart. The data set was analyzed on a dedicated work
station and scored using the Agatston method. Gantry rotation speed
was 250 msecs and collimation was .6 mm. Heart rate was optimized
medically and sl NTG was given. The 3D data set was reconstructed in
5% intervals of the 35-75 % of the R-R cycle. Systolic and diastolic
phases were analyzed on a dedicated work station using MPR, MIP and
VRT modes. The patient received 100mL OMNIPAQUE IOHEXOL 350 MG/ML
SOLN of contrast.

[Series 6: ts diast sharp · axial · 0.39mm/px · z∈[-303,-260]mm · 2 of 326 slices shown]
[im 109/326  lung]
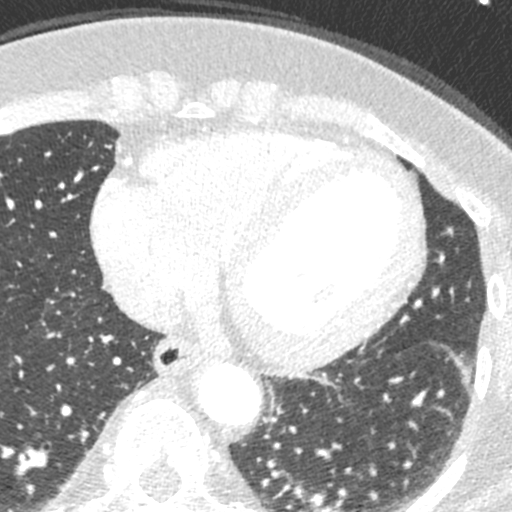
[im 217/326  lung]
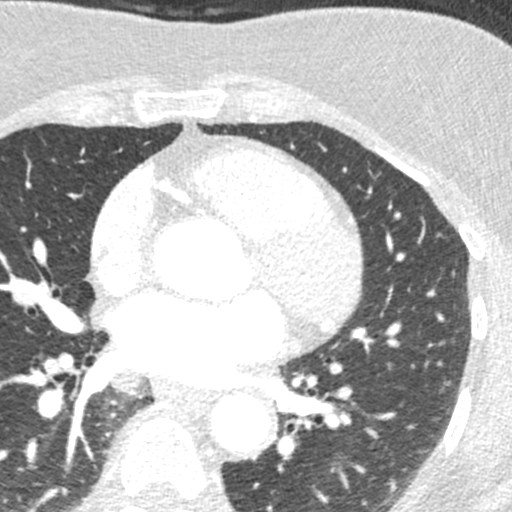

[Series 7: ts syst sharp · axial · 0.39mm/px · z∈[-303,-260]mm · 2 of 326 slices shown]
[im 109/326  lung]
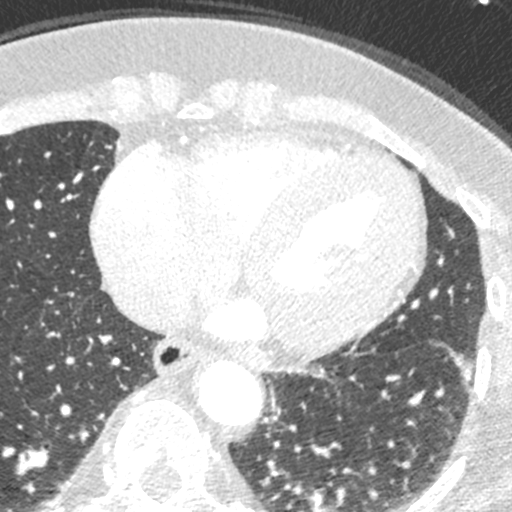
[im 217/326  lung]
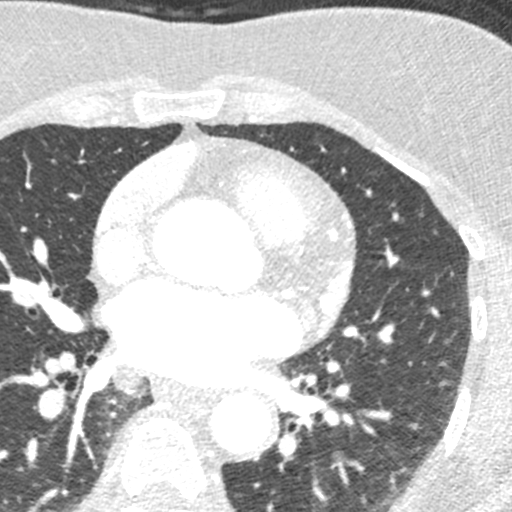

[Series 8: best syst · axial · 0.39mm/px · z∈[-303,-260]mm · 2 of 326 slices shown, 3 images]
[im 109/326  vessel]
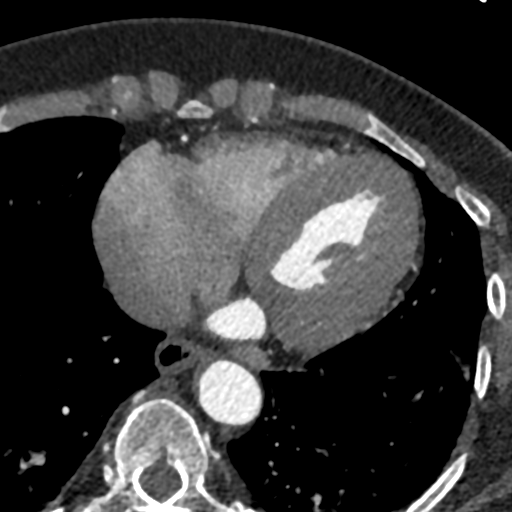
[im 109/326  lung]
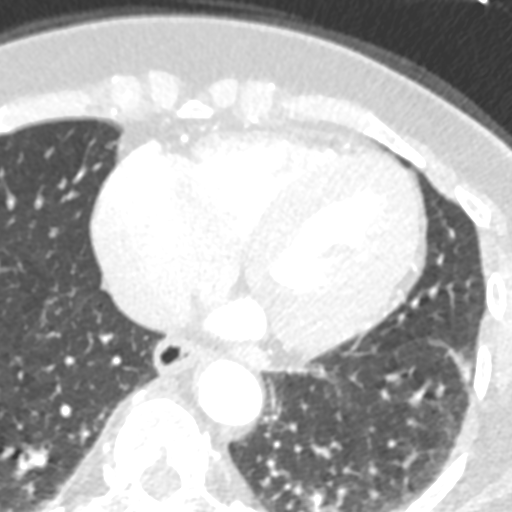
[im 217/326  vessel]
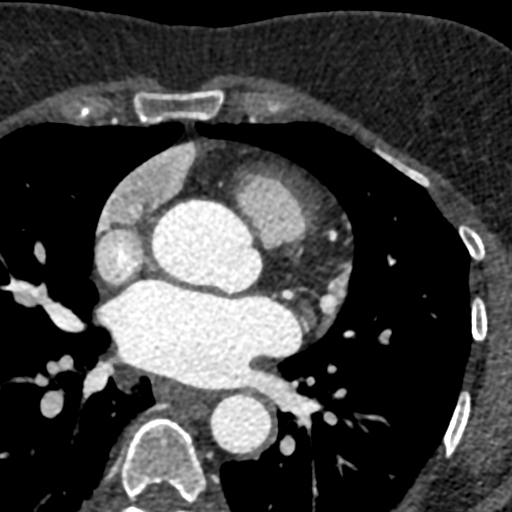

[Series 9: best diast · axial · 0.39mm/px · z∈[-303,-260]mm · 2 of 326 slices shown]
[im 109/326  vessel]
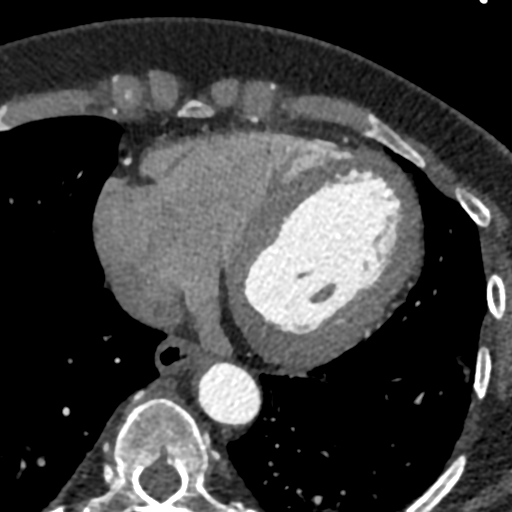
[im 217/326  vessel]
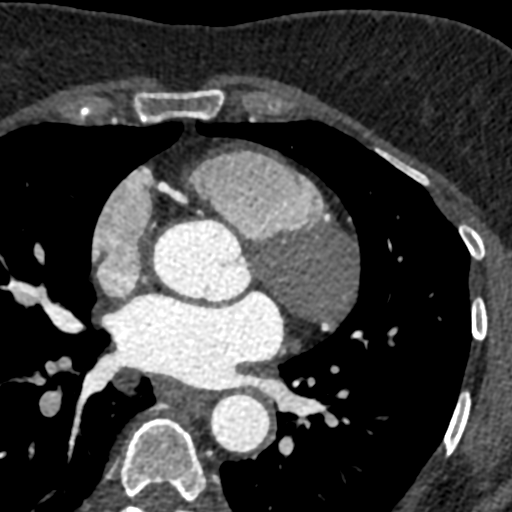

[8 of 20 positions shown; findings below may reference images not displayed]

FINDINGS: Vascular: No significant vascular findings.

Mediastinum/Nodes: No lymphadenopathy.

Lungs/Pleura: No focal airspace disease. No suspicious pulmonary
nodules.

Upper Abdomen: No acute abnormality.

Musculoskeletal: No acute osseous abnormality. No suspicious lytic
or blastic lesions.
IMPRESSION: No acute or significant incidental extracardiac findings in the
chest. Interpretation of cardiac/coronary anatomy by cardiology to
follow.
FINDINGS: Coronary calcium score: The patient's coronary artery calcium score
is 0, which places the patient in the 0 percentile.

Coronary arteries: Normal coronary origins.  Right dominance.

Right Coronary Artery: Normal caliber vessel, gives rise to PDA. No
significant plaque or stenosis.

Left Main Coronary Artery: Normal caliber vessel. No significant
plaque or stenosis.

Left Anterior Descending Coronary Artery: Normal caliber vessel. No
significant plaque or stenosis. Gives rise to 3 small diagonal
branches.

Left Circumflex Artery: Normal caliber vessel. No significant plaque
or stenosis. Gives rise to 1 small OM branch.

Aorta: Normal size, 36 mm at the mid ascending aorta (level of the
PA bifurcation) measured double oblique. No aortic atherosclerosis.
No dissection seen in visualized portions of the aorta.

Aortic Valve: No calcifications. Trileaflet.

Other findings:

Normal pulmonary vein drainage into the left atrium.

Normal left atrial appendage without a thrombus.

Normal size of the pulmonary artery.

Normal appearance of the pericardium.
IMPRESSION: 1. No evidence of CAD, CADRADS = 0.

2. Coronary calcium score of 0. This was 0 percentile for age and
sex matched control.

3. Normal coronary origin with right dominance.

INTERPRETATION:

1. CAD-RADS 0: No evidence of CAD (0%). Consider non-atherosclerotic
causes of chest pain.

2. CAD-RADS 1: Minimal non-obstructive CAD (0-24%). Consider
non-atherosclerotic causes of chest pain. Consider preventive
therapy and risk factor modification.

3. CAD-RADS 2: Mild non-obstructive CAD (25-49%). Consider
non-atherosclerotic causes of chest pain. Consider preventive
therapy and risk factor modification.

4. CAD-RADS 3: Moderate stenosis (50-69%). Consider symptom-guided
anti-ischemic pharmacotherapy as well as risk factor modification
per guideline directed care. Additional analysis with CT FFR will be
submitted.

5. CAD-RADS 4: Severe stenosis. (70-99% or > 50% left main). Cardiac
catheterization or CT FFR is recommended. Consider symptom-guided
anti-ischemic pharmacotherapy as well as risk factor modification
per guideline directed care. Invasive coronary angiography
recommended with revascularization per published guideline
statements.

6. CAD-RADS 5: Total coronary occlusion (100%). Consider cardiac
catheterization or viability assessment. Consider symptom-guided
anti-ischemic pharmacotherapy as well as risk factor modification
per guideline directed care.

7. CAD-RADS N: Non-diagnostic study. Obstructive CAD can't be
excluded. Alternative evaluation is recommended.

*** End of Addendum ***
EXAM:
OVER-READ INTERPRETATION  CT CHEST

The following report is a limited chest CT over-read performed by
This over-read does not include interpretation of cardiac or
coronary anatomy or pathology. The coronary CTA interpretation by
the cardiologist is attached.
FINDINGS: Vascular: No significant vascular findings.

Mediastinum/Nodes: No lymphadenopathy.

Lungs/Pleura: No focal airspace disease. No suspicious pulmonary
nodules.

Upper Abdomen: No acute abnormality.

Musculoskeletal: No acute osseous abnormality. No suspicious lytic
or blastic lesions.
IMPRESSION: No acute or significant incidental extracardiac findings in the
chest. Interpretation of cardiac/coronary anatomy by cardiology to
follow.

## 2024-06-26 ENCOUNTER — Encounter
